# Patient Record
Sex: Female | Born: 1990 | Hispanic: Yes | Marital: Married | Smoking: Former smoker
Health system: Southern US, Community
[De-identification: ages and names within clinical notes are randomized; demographics above are authoritative.]

## PROBLEM LIST (undated history)

## (undated) DIAGNOSIS — Z789 Other specified health status: Secondary | ICD-10-CM

## (undated) HISTORY — PX: APPENDECTOMY: SHX54

---

## 2018-08-26 ENCOUNTER — Emergency Department (HOSPITAL_COMMUNITY): Payer: BLUE CROSS/BLUE SHIELD

## 2018-08-26 ENCOUNTER — Emergency Department (HOSPITAL_COMMUNITY)
Admission: EM | Admit: 2018-08-26 | Discharge: 2018-08-26 | Disposition: A | Discharge status: Home / Self Care | Payer: BLUE CROSS/BLUE SHIELD | Attending: Emergency Medicine | Admitting: Emergency Medicine

## 2018-08-26 ENCOUNTER — Other Ambulatory Visit: Payer: Self-pay

## 2018-08-26 ENCOUNTER — Encounter (HOSPITAL_COMMUNITY): Payer: Self-pay | Admitting: *Deleted

## 2018-08-26 DIAGNOSIS — N390 Urinary tract infection, site not specified: Secondary | ICD-10-CM | POA: Diagnosis not present

## 2018-08-26 DIAGNOSIS — R1030 Lower abdominal pain, unspecified: Secondary | ICD-10-CM | POA: Diagnosis present

## 2018-08-26 LAB — URINALYSIS, ROUTINE W REFLEX MICROSCOPIC
Bilirubin Urine: NEGATIVE
Glucose, UA: NEGATIVE mg/dL
Ketones, ur: 5 mg/dL — AB
NITRITE: POSITIVE — AB
Protein, ur: NEGATIVE mg/dL
Specific Gravity, Urine: 1.017 (ref 1.005–1.030)
pH: 6 (ref 5.0–8.0)

## 2018-08-26 LAB — COMPREHENSIVE METABOLIC PANEL
ALT: 24 U/L (ref 0–44)
AST: 23 U/L (ref 15–41)
Albumin: 4.4 g/dL (ref 3.5–5.0)
Alkaline Phosphatase: 60 U/L (ref 38–126)
Anion gap: 8 (ref 5–15)
BUN: 12 mg/dL (ref 6–20)
CO2: 23 mmol/L (ref 22–32)
Calcium: 9 mg/dL (ref 8.9–10.3)
Chloride: 107 mmol/L (ref 98–111)
Creatinine, Ser: 0.65 mg/dL (ref 0.44–1.00)
GFR calc Af Amer: >60 mL/min (ref >60)
GFR calc non Af Amer: >60 mL/min (ref >60)
Glucose, Bld: 93 mg/dL (ref 70–99)
Potassium: 3.4 mmol/L — ABNORMAL LOW (ref 3.5–5.1)
Sodium: 138 mmol/L (ref 135–145)
Total Bilirubin: 0.5 mg/dL (ref 0.3–1.2)
Total Protein: 7.7 g/dL (ref 6.5–8.1)

## 2018-08-26 LAB — CBC
HCT: 42.3 % (ref 36.0–46.0)
Hemoglobin: 13.8 g/dL (ref 12.0–15.0)
MCH: 29.5 pg (ref 26.0–34.0)
MCHC: 32.6 g/dL (ref 30.0–36.0)
MCV: 90.4 fL (ref 80.0–100.0)
Platelets: 306 10*3/uL (ref 150–400)
RBC: 4.68 MIL/uL (ref 3.87–5.11)
RDW: 12.8 % (ref 11.5–15.5)
WBC: 7.7 10*3/uL (ref 4.0–10.5)
nRBC: 0 % (ref 0.0–0.2)

## 2018-08-26 LAB — LIPASE, BLOOD: Lipase: 39 U/L (ref 11–51)

## 2018-08-26 LAB — I-STAT BETA HCG BLOOD, ED (MC, WL, AP ONLY)

## 2018-08-26 MED ORDER — CEPHALEXIN 500 MG PO CAPS: 500.0000 mg | ORAL_CAPSULE | Freq: Four times a day (QID) | ORAL | 0 refills | Status: AC

## 2018-08-26 MED ORDER — ONDANSETRON HCL 4 MG/2ML IJ SOLN
4.0000 mg | Freq: Once | INTRAMUSCULAR | Status: AC
  Administered 2018-08-26: 4 mg via INTRAVENOUS
  Filled 2018-08-26: qty 2

## 2018-08-26 MED ORDER — CEPHALEXIN 500 MG PO CAPS
500.0000 mg | ORAL_CAPSULE | Freq: Once | ORAL | Status: AC
  Administered 2018-08-26: 500 mg via ORAL
  Filled 2018-08-26: qty 1

## 2018-08-26 MED ORDER — ONDANSETRON 4 MG PO TBDP: 4.0000 mg | ORAL_TABLET | Freq: Three times a day (TID) | ORAL | 0 refills | Status: DC | PRN

## 2018-08-26 MED ORDER — SODIUM CHLORIDE 0.9 % IV BOLUS
1000.0000 mL | Freq: Once | INTRAVENOUS | Status: AC
  Administered 2018-08-26: 1000 mL via INTRAVENOUS

## 2018-08-26 MED ORDER — KETOROLAC TROMETHAMINE 30 MG/ML IJ SOLN
30.0000 mg | Freq: Once | INTRAMUSCULAR | Status: AC
  Administered 2018-08-26: 30 mg via INTRAVENOUS
  Filled 2018-08-26: qty 1

## 2018-08-26 NOTE — Discharge Instructions (Signed)
Stay very well hydrated with plenty of water throughout the day. Please take antibiotic until completion.  Zofran as needed for nausea.  Follow up with primary care physician in 1 week for recheck of ongoing symptoms.  Please seek immediate care if you develop the following: Your symptoms are no better or worse in 3 days. There is severe back pain or lower abdominal pain.  You have a fever.  There is vomiting.  New symptoms develop or you have any additional concerns.

## 2018-08-26 NOTE — ED Provider Notes (Signed)
[facility]-EMERGENCY DEPT Provider Note   CSN: [id number] Arrival date & time: 08/26/18  1407     History   Chief Complaint Chief Complaint  Patient presents with  . Abdominal Pain    HPI Latasha Powell is a 28 y.o. female.  The history is provided by the patient and medical records. A language interpreter was used (Secondary school teacherpanish video interpreter.).  Abdominal Pain  Associated symptoms: dysuria, nausea (Resolved) and vomiting (None today)   Associated symptoms: no constipation and no diarrhea    Latasha Powell is a 28 y.o. female  with a PMH of prior appendectomy who presents to the Emergency Department complaining of lower abdominal pain which began last night shortly after dinner. She went to the restroom and did have pain after urinating.  She denies any fever.  She did have nausea with 2 episodes of emesis last night.  No diarrhea.  She took two ibuprofen and pain improved.  She woke up at about 11 AM this morning and pain was much improved, however still present.  Has continued to have dysuria today.  No vomiting today.  She actually ate a piece of pizza in the waiting room and kept it down fine.  She reports she has history of prior appendectomy a few years ago when she had lower abdominal pain, therefore when her stomach started hurting she became concerned it could be another surgical emergency, therefore she thought she should be safe and come to the emergency department to get checked out.  History reviewed. No pertinent past medical history.  There are no active problems to display for this patient.   Past Surgical History:  Procedure Laterality Date  . APPENDECTOMY    . CESAREAN SECTION       OB History   No obstetric history on file.      Home Medications    Prior to Admission medications   Medication Sig Start Date End Date Taking? Authorizing Provider  cephALEXin (KEFLEX) 500 MG capsule Take 1 capsule (500 mg total)  by mouth 4 (four) times daily for 7 days. 08/26/18 09/02/18  Christalynn Boise, Chase PicketJaime Pilcher, PA-C  ondansetron (ZOFRAN ODT) 4 MG disintegrating tablet Take 1 tablet (4 mg total) by mouth every 8 (eight) hours as needed for nausea or vomiting. 08/26/18   Tex Conroy, Chase PicketJaime Pilcher, PA-C    Family History No family history on file.  Social History Social History   Tobacco Use  . Smoking status: Never Smoker  . Smokeless tobacco: Never Used  Substance Use Topics  . Alcohol use: Yes    Comment: occasional  . Drug use: Not on file     Allergies   Patient has no known allergies.   Review of Systems Review of Systems  Gastrointestinal: Positive for abdominal pain, nausea (Resolved) and vomiting (None today). Negative for constipation and diarrhea.  Genitourinary: Positive for dysuria.  All other systems reviewed and are negative.    Physical Exam Updated Vital Signs BP 119/80 (BP Location: Left Arm)   Pulse 82   Temp 97.8 F (36.6 C) (Oral)   Resp 16   Ht 5\' 3"  (1.6 m)   Wt 68.9 kg   LMP 08/15/2018   SpO2 100%   BMI 26.93 kg/m   Physical Exam Vitals signs and nursing note reviewed.  Constitutional:      General: She is not in acute distress.    Appearance: She is well-developed.     Comments: Nontoxic-appearing.  HENT:  Head: Normocephalic and atraumatic.  Neck:     Musculoskeletal: Neck supple.  Cardiovascular:     Rate and Rhythm: Normal rate and regular rhythm.     Heart sounds: Normal heart sounds. No murmur.  Pulmonary:     Effort: Pulmonary effort is normal. No respiratory distress.     Breath sounds: Normal breath sounds.  Abdominal:     General: There is no distension.     Palpations: Abdomen is soft.     Comments: No abdominal or flank tenderness.  Does have mild right CVA tenderness.  Skin:    General: Skin is warm and dry.  Neurological:     Mental Status: She is alert and oriented to person, place, and time.      ED Treatments / Results  Labs (all labs  ordered are listed, but only abnormal results are displayed) Labs Reviewed  COMPREHENSIVE METABOLIC PANEL - Abnormal; Notable for the following components:      Result Value   Potassium 3.4 (*)    All other components within normal limits  URINALYSIS, ROUTINE W REFLEX MICROSCOPIC - Abnormal; Notable for the following components:   APPearance HAZY (*)    Hgb urine dipstick SMALL (*)    Ketones, ur 5 (*)    Nitrite POSITIVE (*)    Leukocytes, UA TRACE (*)    Bacteria, UA MANY (*)    All other components within normal limits  LIPASE, BLOOD  CBC  I-STAT BETA HCG BLOOD, ED ([facility], [facility], [facility] ONLY)    EKG None  Radiology US Renal  Result Date: 08/26/2018 CLINICAL DATA:  Right flank pain for 1 day EXAM: RENAL / URINARY TRACT ULTRASOUND COMPLETE COMPARISON:  None. FINDINGS: Right Kidney: Renal measurements: 10.6 x 4.3 x 4.4 cm = volume: 107 mL . Echogenicity within normal limits. No mass or hydronephrosis visualized. Left Kidney: Renal measurements: 10.4 x 4.9 x 5.7 cm = volume: 154 mL. Echogenicity within normal limits. No mass or hydronephrosis visualized. Bladder: Appears normal for degree of bladder distention. IMPRESSION: Normal ultrasound of the kidneys and bladder.  No hydronephrosis. Electronically Signed   By: Delbert Phenix M.D.   On: 08/26/2018 21:30    Procedures Procedures (including critical care time)  Medications Ordered in ED Medications  sodium chloride 0.9 % bolus 1,000 mL (1,000 mLs Intravenous New Bag/Given 08/26/18 2119)  cephALEXin (KEFLEX) capsule 500 mg (has no administration in time range)  ondansetron [facility]) injection 4 mg (4 mg Intravenous Given 08/26/18 2119)  ketorolac (TORADOL) 30 MG/ML injection 30 mg (30 mg Intravenous Given 08/26/18 2122)     Initial Impression / Assessment and Plan / ED Course  I have reviewed the triage vital signs and the nursing notes.  Pertinent labs & imaging results that were available during my care of the patient were reviewed by me and  considered in my medical decision making (see chart for details).    Latasha Powell is a 28 y.o. female who presents to ED for dysuria and suprapubic pain which began last night.  Has had 2 episodes of vomiting yesterday, but none today.  Tolerating p.o. today and had a piece of pizza in the waiting room, tolerating that fine.  She is afebrile, nontoxic-appearing with benign abdominal exam.  Does have some mild CVA tenderness.  Labs reviewed and reassuring with normal white count.  Renal ultrasound without acute abnormalities.  UA nitrite positive with many bacteria.  Given dysuria, will treat with Keflex.  Follow-up with PCP strongly  encouraged.  Home care instructions and return precautions discussed and all questions answered.    Final Clinical Impressions(s) / ED Diagnoses   Final diagnoses:  Lower urinary tract infection    ED Discharge Orders         Ordered    cephALEXin (KEFLEX) 500 MG capsule  4 times daily     08/26/18 2206    ondansetron (ZOFRAN ODT) 4 MG disintegrating tablet  Every 8 hours PRN     08/26/18 2206           Zev Blue, Chase PicketJaime Pilcher, PA-C 08/26/18 2212    Tilden Fossaees, Elizabeth, MD 08/27/18 0025

## 2018-08-26 NOTE — ED Triage Notes (Signed)
Pt reports that last night she at a salad with chicken and shortly after she began to have abd pain and vomiting.  Pt took ibuprofen last night with no relief.  Pt reports that when she woke up this morning at 11am, pt still had the diffuse abd pain.  Pt states the pain does not feel like cramping in nature.  Pt denies diarrhea. Hx appendectomy.

## 2019-03-06 ENCOUNTER — Other Ambulatory Visit: Payer: Self-pay

## 2019-03-06 ENCOUNTER — Emergency Department (HOSPITAL_COMMUNITY)
Admission: EM | Admit: 2019-03-06 | Discharge: 2019-03-06 | Disposition: A | Discharge status: Home / Self Care | Payer: BLUE CROSS/BLUE SHIELD | Attending: Emergency Medicine | Admitting: Emergency Medicine

## 2019-03-06 ENCOUNTER — Encounter (HOSPITAL_COMMUNITY): Payer: Self-pay | Admitting: Emergency Medicine

## 2019-03-06 DIAGNOSIS — N10 Acute tubulo-interstitial nephritis: Secondary | ICD-10-CM | POA: Diagnosis not present

## 2019-03-06 DIAGNOSIS — R300 Dysuria: Secondary | ICD-10-CM | POA: Diagnosis present

## 2019-03-06 LAB — URINALYSIS, ROUTINE W REFLEX MICROSCOPIC
Bilirubin Urine: NEGATIVE
Glucose, UA: NEGATIVE mg/dL
Ketones, ur: NEGATIVE mg/dL
Nitrite: POSITIVE — AB
Protein, ur: 30 mg/dL — AB
RBC / HPF: >50 RBC/hpf — ABNORMAL HIGH (ref 0–5)
Specific Gravity, Urine: 1.028 (ref 1.005–1.030)
pH: 6 (ref 5.0–8.0)

## 2019-03-06 MED ORDER — CEFTRIAXONE SODIUM 1 G IJ SOLR
1.0000 g | Freq: Once | INTRAMUSCULAR | Status: AC
  Administered 2019-03-06: 1 g via INTRAMUSCULAR
  Filled 2019-03-06: qty 10

## 2019-03-06 MED ORDER — STERILE WATER FOR INJECTION IJ SOLN
INTRAMUSCULAR | Status: AC
  Administered 2019-03-06: 2.1 mL
  Filled 2019-03-06: qty 10

## 2019-03-06 MED ORDER — HYDROCODONE-ACETAMINOPHEN 5-325 MG PO TABS
1.0000 | ORAL_TABLET | Freq: Once | ORAL | Status: AC
  Administered 2019-03-06: 1 via ORAL
  Filled 2019-03-06: qty 1

## 2019-03-06 MED ORDER — PHENAZOPYRIDINE HCL 97.2 MG PO TABS: 97.0000 mg | ORAL_TABLET | Freq: Three times a day (TID) | ORAL | 0 refills | Status: DC | PRN

## 2019-03-06 MED ORDER — CEPHALEXIN 500 MG PO CAPS: 500.0000 mg | ORAL_CAPSULE | Freq: Four times a day (QID) | ORAL | 0 refills | Status: AC

## 2019-03-06 NOTE — Discharge Instructions (Addendum)
¡  Tome todos sus antibiticos hasta terminar! Tome sus antibiticos con comida. Los efectos secundarios comunes de los antibiticos incluyen nuseas, vmitos, Tree surgeon abdominal y [place]. Puede ayudar a compensar algo de esto con probiticos que puede comprar u Agricultural engineer. No coma ni tome los probiticos hasta 2 horas despus de su antibitico.  Algunos estudios sugieren que ciertos antibiticos pueden reducir la eficacia de ciertas pldoras anticonceptivas orales (control de la natalidad), as que use anticonceptivos adicionales (condones u otro mtodo de Chiropractor) mientras est tomando los antibiticos y [place] [address] [place] despus si est tomando los antibiticos. una mujer con estos medicamentos.  Alterne 600 mg de ibuprofeno y [phone] mg de Tylenol cada 3 horas segn sea necesario para el dolor. No exceda los 4000 mg de Tylenol al da.  Tome Azo para el tratamiento del ardor al Garment/textile technologist.  Descanse y beba muchos lquidos.  Haga un seguimiento con su mdico de cabecera en 3 das para discutir sus diagnsticos y una evaluacin adicional despus de la visita de hoy; regrese a la sala de emergencias si tiene fiebre, vmitos persistentes, empeoramiento del dolor abdominal u otros sntomas preocupantes.  Please take all of your antibiotics until finished!   Take your antibiotics with food.  Common side effects of antibiotics include nausea, vomiting, abdominal discomfort, and diarrhea. You may help offset some of this with probiotics which you can buy or get in yogurt. Do not eat  or take the probiotics until 2 hours after your antibiotic.    Some studies suggest that certain antibiotics can reduce the efficacy of certain oral contraceptive pills (birth control), so please use additional contraceptives (condoms or other barrier method) while you are taking the antibiotics and for an additional 5 to 7 days afterwards if you are a female on these medications.  Alternate 600 mg of ibuprofen  and [phone] mg of Tylenol every 3 hours as needed for pain. Do not exceed 4000 mg of Tylenol daily.  Take Azo for treatment of burning when you pee.  Rest and drink plenty of fluids.  Please followup with your primary doctor in 3 days for discussion of your diagnoses and further evaluation after today's visit; return to the ER for fevers, persistent vomiting, worsening abdominal pain or other concerning symptoms.

## 2019-03-06 NOTE — ED Triage Notes (Signed)
Patient c/o pain with urination since yesterday. Denies N/V/D.

## 2019-03-06 NOTE — ED Provider Notes (Signed)
[facility] DEPT Provider Note   CSN: [id number] Arrival date & time: 03/06/19  2046    History   Chief Complaint Chief Complaint  Patient presents with  . Dysuria    HPI Latasha Powell is a 28 y.o. female with history of prior appendectomy presents for evaluation of acute onset, persistent and progressively worsening right flank pain and urinary symptoms since yesterday.  She reports dysuria, urgency, frequency, denies hematuria.  She has had no nausea or vomiting, no fevers but has had some subjective chills.  Pain radiates from the right flank down to the suprapubic region.  Symptoms worsen with certain movements.  She denies vaginal itching or discharge and reports that she is currently completing her menstrual cycle which was normal for her.  She reports no concern for STDs.  She has been taking ibuprofen with some temporary relief of her symptoms.  Presented to the ED with similar symptoms in February and reports this feels similar.  Was treated for UTI with resolution of her symptoms.     The history is provided by the patient.    History reviewed. No pertinent past medical history.  There are no active problems to display for this patient.   Past Surgical History:  Procedure Laterality Date  . APPENDECTOMY    . CESAREAN SECTION       OB History   No obstetric history on file.      Home Medications    Prior to Admission medications   Medication Sig Start Date End Date Taking? Authorizing Provider  ibuprofen (ADVIL) 200 MG tablet Take 200 mg by mouth every 6 (six) hours as needed for moderate pain.   Yes [provider]  cephALEXin (KEFLEX) 500 MG capsule Take 1 capsule (500 mg total) by mouth 4 (four) times daily for 10 days. 03/06/19 03/16/19  Rodell Perna A, PA-C  ondansetron (ZOFRAN ODT) 4 MG disintegrating tablet Take 1 tablet (4 mg total) by mouth every 8 (eight) hours as needed for nausea or vomiting. Patient  not taking: Reported on 03/06/2019 08/26/18   Ward, Ozella Almond, PA-C  phenazopyridine (PYRIDIUM) 97 MG tablet Take 1 tablet (97 mg total) by mouth 3 (three) times daily as needed for pain. 03/06/19   Renita Papa, PA-C    Family History No family history on file.  Social History Social History   Tobacco Use  . Smoking status: Never Smoker  . Smokeless tobacco: Never Used  Substance Use Topics  . Alcohol use: Yes    Comment: occasional  . Drug use: Not on file     Allergies   Patient has no known allergies.   Review of Systems Review of Systems  Constitutional: Positive for chills. Negative for fatigue and fever.  Gastrointestinal: Positive for abdominal pain. Negative for diarrhea, nausea and vomiting.  Genitourinary: Positive for dysuria, flank pain, frequency, urgency and vaginal bleeding (currently mednsturating). Negative for hematuria, vaginal discharge and vaginal pain.  All other systems reviewed and are negative.    Physical Exam Updated Vital Signs BP 114/81   Pulse 84   Temp 98.5 F (36.9 C) (Oral)   Resp 18   LMP 02/27/2019 (Approximate)   SpO2 100%   Physical Exam Vitals signs and nursing note reviewed.  Constitutional:      General: She is not in acute distress.    Appearance: She is well-developed.  HENT:     Head: Normocephalic and atraumatic.  Eyes:     General:  Right eye: No discharge.        Left eye: No discharge.     Conjunctiva/sclera: Conjunctivae normal.  Neck:     Musculoskeletal: Neck supple.     Vascular: No JVD.     Trachea: No tracheal deviation.  Cardiovascular:     Rate and Rhythm: Normal rate and regular rhythm.  Pulmonary:     Effort: Pulmonary effort is normal.     Breath sounds: Normal breath sounds.  Abdominal:     General: Abdomen is flat. Bowel sounds are normal. There is no distension.     Palpations: Abdomen is soft.     Tenderness: There is abdominal tenderness in the right lower quadrant and suprapubic  area. There is right CVA tenderness. There is no guarding or rebound.  Genitourinary:    Comments: deferred  Skin:    General: Skin is warm and dry.     Findings: No erythema.  Neurological:     Mental Status: She is alert.  Psychiatric:        Behavior: Behavior normal.      ED Treatments / Results  Labs (all labs ordered are listed, but only abnormal results are displayed) Labs Reviewed  URINALYSIS, ROUTINE W REFLEX MICROSCOPIC - Abnormal; Notable for the following components:      Result Value   APPearance HAZY (*)    Hgb urine dipstick LARGE (*)    Protein, ur 30 (*)    Nitrite POSITIVE (*)    Leukocytes,Ua SMALL (*)    RBC / HPF >50 (*)    Bacteria, UA MANY (*)    All other components within normal limits  URINE CULTURE    EKG None  Radiology No results found.  Procedures Procedures (including critical care time)  Medications Ordered in ED Medications  sterile water (preservative free) injection (has no administration in time range)  cefTRIAXone (ROCEPHIN) injection 1 g (1 g Intramuscular Given 03/06/19 2346)  HYDROcodone-acetaminophen (NORCO/VICODIN) 5-325 MG per tablet 1 tablet (1 tablet Oral Given 03/06/19 2340)     Initial Impression / Assessment and Plan / ED Course  I have reviewed the triage vital signs and the nursing notes.  Pertinent labs & imaging results that were available during my care of the patient were reviewed by me and considered in my medical decision making (see chart for details).        Patient with urinary symptoms, subjective chills, right flank pain since yesterday.  She is afebrile, vital signs are stable.  She is nontoxic in appearance.  No peritoneal signs on examination of the abdomen.  She has no concern for STDs and she has no GU complaints, currently completing her menstrual cycle as expected.  UA suggestive of UTI.  Coupled with flank pain, diagnosis of acute pyelonephritis.  Given she is tolerating p.o. and has no  constitutional symptoms, doubt complicated pyelonephritis, renal abscess, nephrolithiasis.  She is already status post a sinusitis and her exam is not concerning for acute surgical abdominal pathology.  Doubt PID, ovarian torsion, TOA, or ectopic pregnancy.  Will discharge with course of antibiotics and Pyridium for symptoms.  Recommend follow-up with PCP for reevaluation of symptoms.  We will culture her urine.  Discussed strict ED return precautions. Patient verbalized understanding of and agreement with plan and is safe for discharge home at this time.   Final Clinical Impressions(s) / ED Diagnoses   Final diagnoses:  Acute pyelonephritis    ED Discharge Orders  Ordered    cephALEXin (KEFLEX) 500 MG capsule  4 times daily     03/06/19 2327    phenazopyridine (PYRIDIUM) 97 MG tablet  3 times daily PRN     03/06/19 2332           Jeanie SewerFawze, Lilyahna Sirmon A, PA-C 03/06/19 2349    Wynetta FinesMessick, Peter C, MD 03/07/19 [phone]

## 2019-03-06 NOTE — ED Notes (Signed)
Urine and culture sent to lab  

## 2019-03-09 LAB — URINE CULTURE: Culture: >=100000 — AB

## 2019-03-10 ENCOUNTER — Telehealth: Payer: Self-pay | Admitting: Emergency Medicine

## 2019-03-10 NOTE — Telephone Encounter (Signed)
Post ED Visit - Positive Culture Follow-up  Culture report reviewed by antimicrobial stewardship pharmacist: [place] Team []  Elenor Quinones, Pharm.D. []  Heide Guile, Pharm.D., BCPS AQ-ID []  Parks Neptune, Pharm.D., BCPS []  Alycia Rossetti, Pharm.D., BCPS []  [place], Pharm.D., BCPS, AAHIVP []  Legrand Como, Pharm.D., BCPS, AAHIVP []  Salome Arnt, PharmD, BCPS []  Johnnette Gourd, PharmD, BCPS []  Hughes Better, PharmD, BCPS []  Leeroy Cha, PharmD []  Laqueta Linden, PharmD, BCPS []  Albertina Parr, PharmD  [place] Team []  Leodis Sias, PharmD []  Lindell Spar, PharmD []  Royetta Asal, PharmD []  Graylin Shiver, Rph []  Rema Fendt) Glennon Mac, PharmD []  Arlyn Dunning, PharmD []  Netta Cedars, PharmD []  Dia Sitter, PharmD []  Leone Haven, PharmD []  Gretta Arab, PharmD []  Theodis Shove, PharmD []  Peggyann Juba, PharmD [x]  Dolly Rias, PharmD   Positive urine culture Treated with Cephalexin, organism sensitive to the same and no further patient follow-up is required at this time.  [place] 03/10/2019, 1:08 PM

## 2019-10-25 ENCOUNTER — Emergency Department (HOSPITAL_COMMUNITY): Payer: 59

## 2019-10-25 ENCOUNTER — Inpatient Hospital Stay (HOSPITAL_COMMUNITY)
Admission: EM | Admit: 2019-10-25 | Discharge: 2019-10-31 | DRG: 958 | Disposition: A | Discharge status: Home / Self Care | Payer: 59 | Attending: General Surgery | Admitting: General Surgery

## 2019-10-25 ENCOUNTER — Observation Stay (HOSPITAL_COMMUNITY): Payer: 59

## 2019-10-25 ENCOUNTER — Encounter (HOSPITAL_COMMUNITY): Admission: EM | Disposition: A | Discharge status: Home / Self Care | Payer: Self-pay | Source: Home / Self Care

## 2019-10-25 ENCOUNTER — Observation Stay (HOSPITAL_COMMUNITY): Payer: 59 | Admitting: Certified Registered"

## 2019-10-25 ENCOUNTER — Encounter (HOSPITAL_COMMUNITY): Payer: Self-pay

## 2019-10-25 DIAGNOSIS — S42401B Unspecified fracture of lower end of right humerus, initial encounter for open fracture: Secondary | ICD-10-CM

## 2019-10-25 DIAGNOSIS — S270XXA Traumatic pneumothorax, initial encounter: Secondary | ICD-10-CM | POA: Diagnosis present

## 2019-10-25 DIAGNOSIS — S52021B Displaced fracture of olecranon process without intraarticular extension of right ulna, initial encounter for open fracture type I or II: Principal | ICD-10-CM | POA: Diagnosis present

## 2019-10-25 DIAGNOSIS — S46311A Strain of muscle, fascia and tendon of triceps, right arm, initial encounter: Secondary | ICD-10-CM | POA: Diagnosis present

## 2019-10-25 DIAGNOSIS — R319 Hematuria, unspecified: Secondary | ICD-10-CM | POA: Diagnosis not present

## 2019-10-25 DIAGNOSIS — S80211A Abrasion, right knee, initial encounter: Secondary | ICD-10-CM | POA: Diagnosis present

## 2019-10-25 DIAGNOSIS — S40211A Abrasion of right shoulder, initial encounter: Secondary | ICD-10-CM | POA: Diagnosis present

## 2019-10-25 DIAGNOSIS — Y92410 Unspecified street and highway as the place of occurrence of the external cause: Secondary | ICD-10-CM

## 2019-10-25 DIAGNOSIS — J939 Pneumothorax, unspecified: Secondary | ICD-10-CM

## 2019-10-25 DIAGNOSIS — S060X0A Concussion without loss of consciousness, initial encounter: Secondary | ICD-10-CM | POA: Diagnosis present

## 2019-10-25 DIAGNOSIS — T1490XA Injury, unspecified, initial encounter: Secondary | ICD-10-CM

## 2019-10-25 DIAGNOSIS — S21101A Unspecified open wound of right front wall of thorax without penetration into thoracic cavity, initial encounter: Secondary | ICD-10-CM | POA: Diagnosis not present

## 2019-10-25 DIAGNOSIS — Z87891 Personal history of nicotine dependence: Secondary | ICD-10-CM

## 2019-10-25 DIAGNOSIS — Z23 Encounter for immunization: Secondary | ICD-10-CM

## 2019-10-25 DIAGNOSIS — M25551 Pain in right hip: Secondary | ICD-10-CM | POA: Diagnosis not present

## 2019-10-25 DIAGNOSIS — N898 Other specified noninflammatory disorders of vagina: Secondary | ICD-10-CM | POA: Diagnosis not present

## 2019-10-25 DIAGNOSIS — Z793 Long term (current) use of hormonal contraceptives: Secondary | ICD-10-CM

## 2019-10-25 DIAGNOSIS — W19XXXA Unspecified fall, initial encounter: Secondary | ICD-10-CM

## 2019-10-25 DIAGNOSIS — Z20822 Contact with and (suspected) exposure to covid-19: Secondary | ICD-10-CM | POA: Diagnosis not present

## 2019-10-25 DIAGNOSIS — Z9181 History of falling: Secondary | ICD-10-CM

## 2019-10-25 DIAGNOSIS — T07XXXA Unspecified multiple injuries, initial encounter: Secondary | ICD-10-CM

## 2019-10-25 HISTORY — DX: Other specified health status: Z78.9

## 2019-10-25 HISTORY — PX: I & D EXTREMITY: SHX5045

## 2019-10-25 HISTORY — DX: Unspecified fracture of lower end of right humerus, initial encounter for open fracture: S42.401B

## 2019-10-25 LAB — CBC
HCT: 41.7 % (ref 36.0–46.0)
Hemoglobin: 13.4 g/dL (ref 12.0–15.0)
MCH: 29.7 pg (ref 26.0–34.0)
MCHC: 32.1 g/dL (ref 30.0–36.0)
MCV: 92.5 fL (ref 80.0–100.0)
Platelets: 351 10*3/uL (ref 150–400)
RBC: 4.51 MIL/uL (ref 3.87–5.11)
RDW: 12.7 % (ref 11.5–15.5)
WBC: 9.3 10*3/uL (ref 4.0–10.5)
nRBC: 0 % (ref 0.0–0.2)

## 2019-10-25 LAB — COMPREHENSIVE METABOLIC PANEL
ALT: 51 U/L — ABNORMAL HIGH (ref 0–44)
AST: 73 U/L — ABNORMAL HIGH (ref 15–41)
Albumin: 4.1 g/dL (ref 3.5–5.0)
Alkaline Phosphatase: 60 U/L (ref 38–126)
Anion gap: 13 (ref 5–15)
BUN: 17 mg/dL (ref 6–20)
CO2: 23 mmol/L (ref 22–32)
Calcium: 9.2 mg/dL (ref 8.9–10.3)
Chloride: 105 mmol/L (ref 98–111)
Creatinine, Ser: 1 mg/dL (ref 0.44–1.00)
GFR calc Af Amer: >60 mL/min (ref >60)
GFR calc non Af Amer: >60 mL/min (ref >60)
Glucose, Bld: 128 mg/dL — ABNORMAL HIGH (ref 70–99)
Potassium: 3.9 mmol/L (ref 3.5–5.1)
Sodium: 141 mmol/L (ref 135–145)
Total Bilirubin: 1.2 mg/dL (ref 0.3–1.2)
Total Protein: 6.8 g/dL (ref 6.5–8.1)

## 2019-10-25 LAB — URINALYSIS, ROUTINE W REFLEX MICROSCOPIC
Bilirubin Urine: NEGATIVE
Glucose, UA: NEGATIVE mg/dL
Hgb urine dipstick: NEGATIVE
Ketones, ur: NEGATIVE mg/dL
Leukocytes,Ua: NEGATIVE
Nitrite: POSITIVE — AB
Protein, ur: 30 mg/dL — AB
Specific Gravity, Urine: >1.046 — ABNORMAL HIGH (ref 1.005–1.030)
pH: 7 (ref 5.0–8.0)

## 2019-10-25 LAB — I-STAT CHEM 8, ED
BUN: 21 mg/dL — ABNORMAL HIGH (ref 6–20)
Calcium, Ion: 1.21 mmol/L (ref 1.15–1.40)
Chloride: 104 mmol/L (ref 98–111)
Creatinine, Ser: 0.9 mg/dL (ref 0.44–1.00)
Glucose, Bld: 123 mg/dL — ABNORMAL HIGH (ref 70–99)
HCT: 40 % (ref 36.0–46.0)
Hemoglobin: 13.6 g/dL (ref 12.0–15.0)
Potassium: 3.4 mmol/L — ABNORMAL LOW (ref 3.5–5.1)
Sodium: 142 mmol/L (ref 135–145)
TCO2: 27 mmol/L (ref 22–32)

## 2019-10-25 LAB — RESPIRATORY PANEL BY RT PCR (FLU A&B, COVID)
Influenza A by PCR: NEGATIVE
Influenza B by PCR: NEGATIVE
SARS Coronavirus 2 by RT PCR: NEGATIVE

## 2019-10-25 LAB — I-STAT BETA HCG BLOOD, ED (MC, WL, AP ONLY): I-stat hCG, quantitative: <5 m[IU]/mL (ref <5)

## 2019-10-25 LAB — LACTIC ACID, PLASMA: Lactic Acid, Venous: 1.9 mmol/L (ref 0.5–1.9)

## 2019-10-25 LAB — SAMPLE TO BLOOD BANK

## 2019-10-25 LAB — PROTIME-INR
INR: 1.1 (ref 0.8–1.2)
Prothrombin Time: 14 seconds (ref 11.4–15.2)

## 2019-10-25 LAB — ETHANOL: Alcohol, Ethyl (B): <10 mg/dL (ref <10)

## 2019-10-25 SURGERY — IRRIGATION AND DEBRIDEMENT EXTREMITY
Anesthesia: General | Site: Elbow | Laterality: Right

## 2019-10-25 MED ORDER — ENOXAPARIN SODIUM 40 MG/0.4ML ~~LOC~~ SOLN
40.0000 mg | SUBCUTANEOUS | Status: DC
  Administered 2019-10-26: 40 mg via SUBCUTANEOUS

## 2019-10-25 MED ORDER — BACITRACIN-NEOMYCIN-POLYMYXIN OINTMENT TUBE
1.0000 "application " | TOPICAL_OINTMENT | Freq: Every day | CUTANEOUS | Status: DC
  Administered 2019-10-26 – 2019-10-31 (×6): 1 via TOPICAL
  Filled 2019-10-25: qty 14

## 2019-10-25 MED ORDER — HYDROMORPHONE HCL 1 MG/ML IJ SOLN
1.0000 mg | Freq: Once | INTRAMUSCULAR | Status: AC
  Administered 2019-10-25: 1 mg via INTRAVENOUS
  Filled 2019-10-25: qty 1

## 2019-10-25 MED ORDER — TETANUS-DIPHTH-ACELL PERTUSSIS 5-2.5-18.5 LF-MCG/0.5 IM SUSP
0.5000 mL | Freq: Once | INTRAMUSCULAR | Status: AC
  Administered 2019-10-25: 13:00:00 0.5 mL via INTRAMUSCULAR
  Filled 2019-10-25: qty 0.5

## 2019-10-25 MED ORDER — ONDANSETRON HCL 4 MG/2ML IJ SOLN: 4.0000 mg | Freq: Four times a day (QID) | INTRAMUSCULAR | Status: DC | PRN

## 2019-10-25 MED ORDER — FENTANYL CITRATE (PF) 100 MCG/2ML IJ SOLN
INTRAMUSCULAR | Status: AC | PRN
  Administered 2019-10-25: 50 ug via INTRAVENOUS

## 2019-10-25 MED ORDER — HYDROMORPHONE HCL 1 MG/ML IJ SOLN
0.2500 mg | INTRAMUSCULAR | Status: DC | PRN
  Administered 2019-10-25 (×2): 0.25 mg via INTRAVENOUS
  Administered 2019-10-25 (×3): 0.5 mg via INTRAVENOUS

## 2019-10-25 MED ORDER — OXYCODONE HCL 5 MG PO TABS: 5.0000 mg | ORAL_TABLET | ORAL | Status: DC | PRN

## 2019-10-25 MED ORDER — POLYETHYLENE GLYCOL 3350 17 G PO PACK: 17.0000 g | PACK | Freq: Every day | ORAL | Status: DC | PRN

## 2019-10-25 MED ORDER — METOCLOPRAMIDE HCL 5 MG/ML IJ SOLN: 5.0000 mg | Freq: Three times a day (TID) | INTRAMUSCULAR | Status: DC | PRN

## 2019-10-25 MED ORDER — BISACODYL 5 MG PO TBEC: 5.0000 mg | DELAYED_RELEASE_TABLET | Freq: Every day | ORAL | Status: DC | PRN

## 2019-10-25 MED ORDER — TOBRAMYCIN SULFATE 1.2 G IJ SOLR
INTRAMUSCULAR | Status: DC | PRN
  Administered 2019-10-25: 1.2 g via TOPICAL

## 2019-10-25 MED ORDER — LACTATED RINGERS IV SOLN: INTRAVENOUS | Status: DC

## 2019-10-25 MED ORDER — VANCOMYCIN HCL 1000 MG IV SOLR
INTRAVENOUS | Status: AC
  Filled 2019-10-25: qty 1000

## 2019-10-25 MED ORDER — MIDAZOLAM HCL 2 MG/2ML IJ SOLN
INTRAMUSCULAR | Status: DC | PRN
  Administered 2019-10-25: 2 mg via INTRAVENOUS

## 2019-10-25 MED ORDER — PROPOFOL 10 MG/ML IV BOLUS
INTRAVENOUS | Status: DC | PRN
  Administered 2019-10-25: 150 mg via INTRAVENOUS

## 2019-10-25 MED ORDER — CEFAZOLIN SODIUM-DEXTROSE 2-4 GM/100ML-% IV SOLN
2.0000 g | Freq: Three times a day (TID) | INTRAVENOUS | Status: DC
  Filled 2019-10-25: qty 100

## 2019-10-25 MED ORDER — METRONIDAZOLE IN NACL 5-0.79 MG/ML-% IV SOLN
500.0000 mg | Freq: Three times a day (TID) | INTRAVENOUS | Status: AC
  Administered 2019-10-26 (×3): 500 mg via INTRAVENOUS
  Filled 2019-10-25 (×3): qty 100

## 2019-10-25 MED ORDER — ONDANSETRON HCL 4 MG PO TABS: 4.0000 mg | ORAL_TABLET | Freq: Four times a day (QID) | ORAL | Status: DC | PRN

## 2019-10-25 MED ORDER — INDOMETHACIN 25 MG PO CAPS
50.0000 mg | ORAL_CAPSULE | Freq: Three times a day (TID) | ORAL | Status: DC
  Administered 2019-10-26 – 2019-10-28 (×8): 50 mg via ORAL
  Filled 2019-10-25 (×10): qty 2

## 2019-10-25 MED ORDER — MIDAZOLAM HCL 2 MG/2ML IJ SOLN
INTRAMUSCULAR | Status: AC
  Filled 2019-10-25: qty 2

## 2019-10-25 MED ORDER — FENTANYL CITRATE (PF) 100 MCG/2ML IJ SOLN
INTRAMUSCULAR | Status: AC
  Filled 2019-10-25: qty 2

## 2019-10-25 MED ORDER — OXYCODONE HCL 5 MG/5ML PO SOLN: 5.0000 mg | Freq: Once | ORAL | Status: DC | PRN

## 2019-10-25 MED ORDER — VANCOMYCIN HCL 1000 MG IV SOLR
INTRAVENOUS | Status: DC | PRN
  Administered 2019-10-25: 1000 mg via TOPICAL

## 2019-10-25 MED ORDER — OXYCODONE HCL 5 MG PO TABS
10.0000 mg | ORAL_TABLET | ORAL | Status: DC | PRN
  Administered 2019-10-25 – 2019-10-26 (×2): 10 mg via ORAL
  Filled 2019-10-25 (×2): qty 2

## 2019-10-25 MED ORDER — FENTANYL CITRATE (PF) 250 MCG/5ML IJ SOLN
INTRAMUSCULAR | Status: DC | PRN
  Administered 2019-10-25: 50 ug via INTRAVENOUS
  Administered 2019-10-25: 100 ug via INTRAVENOUS

## 2019-10-25 MED ORDER — DOCUSATE SODIUM 100 MG PO CAPS
100.0000 mg | ORAL_CAPSULE | Freq: Two times a day (BID) | ORAL | Status: DC
  Administered 2019-10-25 – 2019-10-31 (×11): 100 mg via ORAL
  Filled 2019-10-25 (×10): qty 1

## 2019-10-25 MED ORDER — BUPIVACAINE HCL (PF) 0.25 % IJ SOLN
INTRAMUSCULAR | Status: AC
  Filled 2019-10-25: qty 30

## 2019-10-25 MED ORDER — POVIDONE-IODINE 10 % EX SWAB: 2.0000 "application " | Freq: Once | CUTANEOUS | Status: DC

## 2019-10-25 MED ORDER — FENTANYL CITRATE (PF) 100 MCG/2ML IJ SOLN
50.0000 ug | Freq: Once | INTRAMUSCULAR | Status: AC
  Administered 2019-10-25: 50 ug via INTRAVENOUS

## 2019-10-25 MED ORDER — HYDROMORPHONE HCL 1 MG/ML IJ SOLN
0.5000 mg | INTRAMUSCULAR | Status: DC | PRN
  Administered 2019-10-26 (×2): 1 mg via INTRAVENOUS
  Filled 2019-10-25 (×2): qty 1

## 2019-10-25 MED ORDER — ONDANSETRON HCL 4 MG/2ML IJ SOLN
4.0000 mg | Freq: Once | INTRAMUSCULAR | Status: AC
  Administered 2019-10-25: 12:00:00 4 mg via INTRAVENOUS

## 2019-10-25 MED ORDER — KETOROLAC TROMETHAMINE 30 MG/ML IJ SOLN: 30.0000 mg | Freq: Once | INTRAMUSCULAR | Status: DC

## 2019-10-25 MED ORDER — LIDOCAINE 2% (20 MG/ML) 5 ML SYRINGE
INTRAMUSCULAR | Status: DC | PRN
  Administered 2019-10-25: 60 mg via INTRAVENOUS

## 2019-10-25 MED ORDER — ACETAMINOPHEN 500 MG PO TABS
1000.0000 mg | ORAL_TABLET | Freq: Three times a day (TID) | ORAL | Status: DC
  Administered 2019-10-25 – 2019-10-26 (×2): 1000 mg via ORAL
  Filled 2019-10-25 (×2): qty 2

## 2019-10-25 MED ORDER — FENTANYL CITRATE (PF) 250 MCG/5ML IJ SOLN
INTRAMUSCULAR | Status: AC
  Filled 2019-10-25: qty 5

## 2019-10-25 MED ORDER — PROPOFOL 10 MG/ML IV BOLUS
INTRAVENOUS | Status: AC
  Filled 2019-10-25: qty 20

## 2019-10-25 MED ORDER — SODIUM CHLORIDE 0.9 % IR SOLN
Status: DC | PRN
  Administered 2019-10-25: 6000 mL

## 2019-10-25 MED ORDER — ACETAMINOPHEN 10 MG/ML IV SOLN
INTRAVENOUS | Status: AC
  Filled 2019-10-25: qty 100

## 2019-10-25 MED ORDER — MORPHINE SULFATE (PF) 2 MG/ML IV SOLN: 2.0000 mg | INTRAVENOUS | Status: DC | PRN

## 2019-10-25 MED ORDER — METHOCARBAMOL 500 MG PO TABS: 500.0000 mg | ORAL_TABLET | Freq: Four times a day (QID) | ORAL | Status: DC | PRN

## 2019-10-25 MED ORDER — ONDANSETRON HCL 4 MG/2ML IJ SOLN
INTRAMUSCULAR | Status: AC
  Filled 2019-10-25: qty 2

## 2019-10-25 MED ORDER — ENOXAPARIN SODIUM 40 MG/0.4ML ~~LOC~~ SOLN: 40.0000 mg | SUBCUTANEOUS | Status: DC

## 2019-10-25 MED ORDER — 0.9 % SODIUM CHLORIDE (POUR BTL) OPTIME
TOPICAL | Status: DC | PRN
  Administered 2019-10-25: 1000 mL

## 2019-10-25 MED ORDER — SUCCINYLCHOLINE CHLORIDE 200 MG/10ML IV SOSY
PREFILLED_SYRINGE | INTRAVENOUS | Status: DC | PRN
  Administered 2019-10-25: 60 mg via INTRAVENOUS

## 2019-10-25 MED ORDER — METOCLOPRAMIDE HCL 5 MG PO TABS: 5.0000 mg | ORAL_TABLET | Freq: Three times a day (TID) | ORAL | Status: DC | PRN

## 2019-10-25 MED ORDER — HYDROMORPHONE HCL 1 MG/ML IJ SOLN
INTRAMUSCULAR | Status: AC
  Filled 2019-10-25: qty 1

## 2019-10-25 MED ORDER — DIPHENHYDRAMINE HCL 12.5 MG/5ML PO ELIX: 12.5000 mg | ORAL_SOLUTION | ORAL | Status: DC | PRN

## 2019-10-25 MED ORDER — ONDANSETRON HCL 4 MG/2ML IJ SOLN
4.0000 mg | Freq: Four times a day (QID) | INTRAMUSCULAR | Status: DC | PRN
  Administered 2019-10-26 – 2019-10-30 (×5): 4 mg via INTRAVENOUS
  Filled 2019-10-25 (×5): qty 2

## 2019-10-25 MED ORDER — CEFAZOLIN SODIUM-DEXTROSE 2-4 GM/100ML-% IV SOLN: 2.0000 g | Freq: Three times a day (TID) | INTRAVENOUS | Status: DC

## 2019-10-25 MED ORDER — ONDANSETRON 4 MG PO TBDP: 4.0000 mg | ORAL_TABLET | Freq: Four times a day (QID) | ORAL | Status: DC | PRN

## 2019-10-25 MED ORDER — ACETAMINOPHEN 10 MG/ML IV SOLN
1000.0000 mg | Freq: Once | INTRAVENOUS | Status: AC
  Administered 2019-10-25: 1000 mg via INTRAVENOUS

## 2019-10-25 MED ORDER — KETOROLAC TROMETHAMINE 30 MG/ML IJ SOLN
INTRAMUSCULAR | Status: DC | PRN
  Administered 2019-10-25: 30 mg via INTRAVENOUS

## 2019-10-25 MED ORDER — CEFAZOLIN SODIUM-DEXTROSE 2-4 GM/100ML-% IV SOLN: 2.0000 g | INTRAVENOUS | Status: DC

## 2019-10-25 MED ORDER — IOHEXOL 300 MG/ML  SOLN
100.0000 mL | Freq: Once | INTRAMUSCULAR | Status: AC | PRN
  Administered 2019-10-25: 100 mL via INTRAVENOUS

## 2019-10-25 MED ORDER — PROMETHAZINE HCL 25 MG/ML IJ SOLN: 6.2500 mg | INTRAMUSCULAR | Status: DC | PRN

## 2019-10-25 MED ORDER — CEFAZOLIN SODIUM-DEXTROSE 2-4 GM/100ML-% IV SOLN
2.0000 g | Freq: Once | INTRAVENOUS | Status: AC
  Administered 2019-10-25: 13:00:00 2 g via INTRAVENOUS
  Filled 2019-10-25: qty 100

## 2019-10-25 MED ORDER — ZOLPIDEM TARTRATE 5 MG PO TABS
5.0000 mg | ORAL_TABLET | Freq: Every evening | ORAL | Status: DC | PRN
  Administered 2019-10-26: 01:00:00 5 mg via ORAL
  Filled 2019-10-25: qty 1

## 2019-10-25 MED ORDER — DOCUSATE SODIUM 100 MG PO CAPS: 100.0000 mg | ORAL_CAPSULE | Freq: Two times a day (BID) | ORAL | Status: DC

## 2019-10-25 MED ORDER — CEFAZOLIN SODIUM-DEXTROSE 2-4 GM/100ML-% IV SOLN
2.0000 g | Freq: Three times a day (TID) | INTRAVENOUS | Status: AC
  Administered 2019-10-25 – 2019-10-26 (×3): 2 g via INTRAVENOUS
  Filled 2019-10-25 (×3): qty 100

## 2019-10-25 MED ORDER — METHOCARBAMOL 1000 MG/10ML IJ SOLN: 500.0000 mg | Freq: Four times a day (QID) | INTRAVENOUS | Status: DC | PRN

## 2019-10-25 MED ORDER — HYDRALAZINE HCL 20 MG/ML IJ SOLN: 10.0000 mg | INTRAMUSCULAR | Status: DC | PRN

## 2019-10-25 MED ORDER — BUPIVACAINE HCL 0.25 % IJ SOLN
INTRAMUSCULAR | Status: DC | PRN
  Administered 2019-10-25: 20 mL

## 2019-10-25 MED ORDER — MAGNESIUM CITRATE PO SOLN: 1.0000 | Freq: Once | ORAL | Status: DC | PRN

## 2019-10-25 MED ORDER — OXYCODONE HCL 5 MG PO TABS: 5.0000 mg | ORAL_TABLET | Freq: Once | ORAL | Status: DC | PRN

## 2019-10-25 MED ORDER — LACTATED RINGERS IV SOLN: INTRAVENOUS | Status: DC | PRN

## 2019-10-25 MED ORDER — ONDANSETRON HCL 4 MG/2ML IJ SOLN
INTRAMUSCULAR | Status: DC | PRN
  Administered 2019-10-25: 4 mg via INTRAVENOUS

## 2019-10-25 MED ORDER — ACETAMINOPHEN 500 MG PO TABS
1000.0000 mg | ORAL_TABLET | Freq: Four times a day (QID) | ORAL | Status: DC
  Administered 2019-10-25: 1000 mg via ORAL
  Filled 2019-10-25: qty 2

## 2019-10-25 MED ORDER — TOBRAMYCIN SULFATE 1.2 G IJ SOLR
INTRAMUSCULAR | Status: AC
  Filled 2019-10-25: qty 1.2

## 2019-10-25 MED ORDER — DEXAMETHASONE SODIUM PHOSPHATE 10 MG/ML IJ SOLN
INTRAMUSCULAR | Status: DC | PRN
  Administered 2019-10-25: 5 mg via INTRAVENOUS

## 2019-10-25 MED ORDER — MORPHINE SULFATE (PF) 4 MG/ML IV SOLN
4.0000 mg | Freq: Once | INTRAVENOUS | Status: AC
  Administered 2019-10-25: 4 mg via INTRAVENOUS
  Filled 2019-10-25: qty 1

## 2019-10-25 MED ORDER — CHLORHEXIDINE GLUCONATE 4 % EX LIQD: 60.0000 mL | Freq: Once | CUTANEOUS | Status: DC

## 2019-10-25 SURGICAL SUPPLY — 65 items
BNDG ELASTIC 4X5.8 VLCR STR LF (GAUZE/BANDAGES/DRESSINGS) ×4 IMPLANT
BNDG ELASTIC 6X5.8 VLCR STR LF (GAUZE/BANDAGES/DRESSINGS) ×4 IMPLANT
BNDG GAUZE ELAST 4 BULKY (GAUZE/BANDAGES/DRESSINGS) ×4 IMPLANT
CANISTER WOUNDNEG PRESSURE 500 (CANNISTER) ×3 IMPLANT
CHLORAPREP W/TINT 26 (MISCELLANEOUS) ×4 IMPLANT
CLOSURE WOUND 1/2 X4 (GAUZE/BANDAGES/DRESSINGS)
CNTNR URN SCR LID CUP LEK RST (MISCELLANEOUS) IMPLANT
CONNECTOR 5 IN 1 STRAIGHT STRL (MISCELLANEOUS) ×3 IMPLANT
CONT SPEC 4OZ STRL OR WHT (MISCELLANEOUS)
COVER SURGICAL LIGHT HANDLE (MISCELLANEOUS) ×8 IMPLANT
COVER WAND RF STERILE (DRAPES) ×4 IMPLANT
CUFF TOURN SGL LL 12 NO SLV (MISCELLANEOUS) IMPLANT
CUFF TOURN SGL QUICK 34 (TOURNIQUET CUFF)
CUFF TRNQT CYL 34X4.125X (TOURNIQUET CUFF) IMPLANT
DRAPE OEC MINIVIEW 54X84 (DRAPES) ×4 IMPLANT
DRAPE ORTHO SPLIT 77X108 STRL (DRAPES) ×4
DRAPE SURG ORHT 6 SPLT 77X108 (DRAPES) ×2 IMPLANT
DRAPE U-SHAPE 47X51 STRL (DRAPES) ×4 IMPLANT
DRESSING PEEL AND PLAC PRVNA20 (GAUZE/BANDAGES/DRESSINGS) ×1 IMPLANT
DRSG MEPILEX BORDER 4X8 (GAUZE/BANDAGES/DRESSINGS) IMPLANT
DRSG PAD ABDOMINAL 8X10 ST (GAUZE/BANDAGES/DRESSINGS) ×1 IMPLANT
DRSG PEEL AND PLACE PREVENA 20 (GAUZE/BANDAGES/DRESSINGS) ×4
DURAPREP 26ML APPLICATOR (WOUND CARE) ×1 IMPLANT
ELECT REM PT RETURN 9FT ADLT (ELECTROSURGICAL) ×4
ELECTRODE REM PT RTRN 9FT ADLT (ELECTROSURGICAL) ×2 IMPLANT
GAUZE SPONGE 4X4 12PLY STRL (GAUZE/BANDAGES/DRESSINGS) ×1 IMPLANT
GAUZE XEROFORM 1X8 LF (GAUZE/BANDAGES/DRESSINGS) ×1 IMPLANT
GLOVE BIOGEL PI IND STRL 8 (GLOVE) ×2 IMPLANT
GLOVE BIOGEL PI INDICATOR 8 (GLOVE) ×2
GLOVE ECLIPSE 8.0 STRL XLNG CF (GLOVE) ×11 IMPLANT
GOWN STRL REUS W/ TWL LRG LVL3 (GOWN DISPOSABLE) ×3 IMPLANT
GOWN STRL REUS W/ TWL XL LVL3 (GOWN DISPOSABLE) ×4 IMPLANT
GOWN STRL REUS W/TWL LRG LVL3 (GOWN DISPOSABLE) ×2
GOWN STRL REUS W/TWL XL LVL3 (GOWN DISPOSABLE) ×4
HANDPIECE INTERPULSE COAX TIP (DISPOSABLE) ×2
KIT BASIN OR (CUSTOM PROCEDURE TRAY) ×4 IMPLANT
KIT TURNOVER KIT B (KITS) ×4 IMPLANT
MANIFOLD NEPTUNE II (INSTRUMENTS) ×4 IMPLANT
NEEDLE 22X1 1/2 (OR ONLY) (NEEDLE) ×3 IMPLANT
NS IRRIG 1000ML POUR BTL (IV SOLUTION) ×4 IMPLANT
PACK ORTHO EXTREMITY (CUSTOM PROCEDURE TRAY) ×4 IMPLANT
PAD ARMBOARD 7.5X6 YLW CONV (MISCELLANEOUS) ×8 IMPLANT
PAD CAST 4YDX4 CTTN HI CHSV (CAST SUPPLIES) ×2 IMPLANT
PADDING CAST COTTON 4X4 STRL (CAST SUPPLIES) ×2
PADDING CAST COTTON 6X4 STRL (CAST SUPPLIES) ×4 IMPLANT
SET HNDPC FAN SPRY TIP SCT (DISPOSABLE) ×1 IMPLANT
SPLINT PLASTER CAST XFAST 5X30 (CAST SUPPLIES) ×1 IMPLANT
SPLINT PLASTER XFAST SET 5X30 (CAST SUPPLIES) ×2
SPONGE LAP 18X18 RF (DISPOSABLE) ×3 IMPLANT
STRIP CLOSURE SKIN 1/2X4 (GAUZE/BANDAGES/DRESSINGS) ×1 IMPLANT
SUCTION FRAZIER HANDLE 10FR (MISCELLANEOUS)
SUCTION TUBE FRAZIER 10FR DISP (MISCELLANEOUS) ×1 IMPLANT
SUT ETHILON 2 0 FS 18 (SUTURE) ×13 IMPLANT
SUT VIC AB 2-0 CT1 27 (SUTURE)
SUT VIC AB 2-0 CT1 TAPERPNT 27 (SUTURE) ×1 IMPLANT
SWAB CULTURE ESWAB REG 1ML (MISCELLANEOUS) IMPLANT
SYR CONTROL 10ML LL (SYRINGE) ×3 IMPLANT
TOWEL GREEN STERILE (TOWEL DISPOSABLE) ×4 IMPLANT
TOWEL GREEN STERILE FF (TOWEL DISPOSABLE) ×4 IMPLANT
TUBE CONNECTING 12'X1/4 (SUCTIONS) ×1
TUBE CONNECTING 12X1/4 (SUCTIONS) ×3 IMPLANT
TUBING BULK SUCTION (MISCELLANEOUS) ×3 IMPLANT
UNDERPAD 30X30 (UNDERPADS AND DIAPERS) ×4 IMPLANT
WATER STERILE IRR 1000ML POUR (IV SOLUTION) ×4 IMPLANT
YANKAUER SUCT BULB TIP NO VENT (SUCTIONS) ×4 IMPLANT
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
## 2019-10-25 NOTE — Consult Note (Signed)
With the help of an interpreter we discussed the case again in the preoperative area.  Patient's husband is present as well.  She has an open right elbow fracture with a triceps avulsion.  She is at high risk for infection, stiffness and wound healing issues secondary to her contaminated wound.  If her wound is appropriate we will perform a all suture based triceps repair using bone tunnels versus an incision and debridement alone.  She understands was a high risk for need for more surgeries.  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  All questions were answered the patient satisfaction.

## 2019-10-25 NOTE — Transfer of Care (Signed)
Immediate Anesthesia Transfer of Care Note  Patient: Latasha Powell  Procedure(s) Performed: IRRIGATION AND DEBRIDEMENT EXTREMITY (Right Elbow) TRICEPS TENDON REPAIR (Right Elbow)  Patient Location: PACU  Anesthesia Type:General  Level of Consciousness: drowsy and patient cooperative  Airway & Oxygen Therapy: Patient Spontanous Breathing  Post-op Assessment: Report given to RN and Post -op Vital signs reviewed and stable  Post vital signs: Reviewed and stable  Last Vitals:  Vitals Value Taken Time  BP 114/81 10/25/19 2129  Temp 36.9 C 10/25/19 2114  Pulse 80 10/25/19 2135  Resp 17 10/25/19 2135  SpO2 99 % 10/25/19 2135  Vitals shown include unvalidated device data.  Last Pain:  Vitals:   10/25/19 2114  TempSrc:   PainSc: Asleep         Complications: No apparent anesthesia complications
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
## 2019-10-25 NOTE — Interval H&P Note (Signed)
History and Physical Interval Note:  10/25/2019 7:21 PM  Latasha Powell  has presented today for surgery, with the diagnosis of open right elbow fx.  The various methods of treatment have been discussed with the patient and family. After consideration of risks, benefits and other options for treatment, the patient has consented to  Procedure(s): IRRIGATION AND DEBRIDEMENT EXTREMITY (Right) TRICEPS TENDON REPAIR (Right) as a surgical intervention.  The patient's history has been reviewed, patient examined, no change in status, stable for surgery.  I have reviewed the patient's chart and labs.  Questions were answered to the patient's satisfaction.     Bjorn Pippin

## 2019-10-25 NOTE — Progress Notes (Signed)
Orthopedic Tech Progress Note Patient Details:  Latasha Powell [birth year] [id number]  Ortho Devices Ortho Device/Splint Location: Level 2 trauma alert       Saul Fordyce 10/25/2019, 11:59 AM
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
## 2019-10-25 NOTE — Anesthesia Procedure Notes (Signed)
Procedure Name: Intubation Date/Time: 10/25/2019 7:38 PM Performed by: Oletta Lamas, CRNA Pre-anesthesia Checklist: Patient identified, Emergency Drugs available, Suction available and Patient being monitored Patient Re-evaluated:Patient Re-evaluated prior to induction Oxygen Delivery Method: Circle System Utilized Preoxygenation: Pre-oxygenation with 100% oxygen Induction Type: IV induction and Rapid sequence Laryngoscope Size: Mac and 4 Grade View: Grade I Tube type: Oral Tube size: 7.0 mm Number of attempts: 1 Airway Equipment and Method: Stylet and Oral airway Placement Confirmation: ETT inserted through vocal cords under direct vision,  positive ETCO2 and breath sounds checked- equal and bilateral Secured at: 22 cm Tube secured with: Tape Dental Injury: Teeth and Oropharynx as per pre-operative assessment
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
## 2019-10-26 ENCOUNTER — Observation Stay (HOSPITAL_COMMUNITY): Payer: 59

## 2019-10-26 ENCOUNTER — Other Ambulatory Visit: Payer: Self-pay

## 2019-10-26 LAB — BASIC METABOLIC PANEL
Anion gap: 11 (ref 5–15)
BUN: 12 mg/dL (ref 6–20)
CO2: 21 mmol/L — ABNORMAL LOW (ref 22–32)
Calcium: 8.5 mg/dL — ABNORMAL LOW (ref 8.9–10.3)
Chloride: 104 mmol/L (ref 98–111)
Creatinine, Ser: 0.72 mg/dL (ref 0.44–1.00)
GFR calc Af Amer: >60 mL/min (ref >60)
GFR calc non Af Amer: >60 mL/min (ref >60)
Glucose, Bld: 136 mg/dL — ABNORMAL HIGH (ref 70–99)
Potassium: 3.7 mmol/L (ref 3.5–5.1)
Sodium: 136 mmol/L (ref 135–145)

## 2019-10-26 LAB — CBC
HCT: 37 % (ref 36.0–46.0)
Hemoglobin: 12.1 g/dL (ref 12.0–15.0)
MCH: 29.6 pg (ref 26.0–34.0)
MCHC: 32.7 g/dL (ref 30.0–36.0)
MCV: 90.5 fL (ref 80.0–100.0)
Platelets: 271 10*3/uL (ref 150–400)
RBC: 4.09 MIL/uL (ref 3.87–5.11)
RDW: 12.8 % (ref 11.5–15.5)
WBC: 13.3 10*3/uL — ABNORMAL HIGH (ref 4.0–10.5)
nRBC: 0 % (ref 0.0–0.2)

## 2019-10-26 LAB — HIV ANTIBODY (ROUTINE TESTING W REFLEX): HIV Screen 4th Generation wRfx: NONREACTIVE

## 2019-10-26 MED ORDER — HYDROMORPHONE HCL 1 MG/ML IJ SOLN
0.5000 mg | Freq: Four times a day (QID) | INTRAMUSCULAR | Status: DC | PRN
  Administered 2019-10-27 – 2019-10-30 (×6): 0.5 mg via INTRAVENOUS
  Filled 2019-10-26 (×6): qty 1

## 2019-10-26 MED ORDER — METHOCARBAMOL 1000 MG/10ML IJ SOLN
1000.0000 mg | Freq: Three times a day (TID) | INTRAVENOUS | Status: DC
  Administered 2019-10-27: 1000 mg via INTRAVENOUS
  Filled 2019-10-26 (×18): qty 10

## 2019-10-26 MED ORDER — OXYCODONE HCL 5 MG PO TABS
5.0000 mg | ORAL_TABLET | ORAL | Status: DC | PRN
  Administered 2019-10-26: 5 mg via ORAL
  Administered 2019-10-27 – 2019-10-28 (×2): 10 mg via ORAL
  Filled 2019-10-26 (×2): qty 2
  Filled 2019-10-26: qty 1

## 2019-10-26 MED ORDER — METHOCARBAMOL 500 MG PO TABS
1000.0000 mg | ORAL_TABLET | Freq: Three times a day (TID) | ORAL | Status: DC
  Administered 2019-10-26 – 2019-10-31 (×14): 1000 mg via ORAL
  Filled 2019-10-26 (×13): qty 2

## 2019-10-26 MED ORDER — ENOXAPARIN SODIUM 30 MG/0.3ML ~~LOC~~ SOLN
30.0000 mg | Freq: Two times a day (BID) | SUBCUTANEOUS | Status: DC
  Administered 2019-10-26 – 2019-10-30 (×8): 30 mg via SUBCUTANEOUS
  Filled 2019-10-26 (×9): qty 0.3

## 2019-10-26 MED ORDER — ACETAMINOPHEN 500 MG PO TABS
1000.0000 mg | ORAL_TABLET | Freq: Four times a day (QID) | ORAL | Status: DC
  Administered 2019-10-27 – 2019-10-31 (×13): 1000 mg via ORAL
  Filled 2019-10-26 (×17): qty 2

## 2019-10-26 NOTE — Anesthesia Postprocedure Evaluation (Signed)
Anesthesia Post Note  Patient: Latasha Powell  Procedure(s) Performed: Right elbow joint incision debridement with excisional debridement of bone and tendon as well as subcutaneous tissue.  Right deep wound incision debridement with excisional debridement of skin ,muscle, and fascia.  (Right Elbow)     Patient location during evaluation: PACU Anesthesia Type: General Level of consciousness: awake and alert Pain management: pain level controlled Vital Signs Assessment: post-procedure vital signs reviewed and stable Respiratory status: spontaneous breathing, nonlabored ventilation, respiratory function stable and patient connected to nasal cannula oxygen Cardiovascular status: blood pressure returned to baseline and stable Postop Assessment: no apparent nausea or vomiting Anesthetic complications: no    Last Vitals:  Vitals:   10/25/19 2318 10/26/19 0407  BP: 120/76 96/61  Pulse: 85 76  Resp: 18 18  Temp: 37.4 C 36.9 C  SpO2: 97% 99%    Last Pain:  Vitals:   10/26/19 0407  TempSrc: Oral  PainSc:                  Catheryn Bacon Labaron Digirolamo
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
## 2019-10-26 NOTE — Social Work (Signed)
CSW met with pt at bedside. CSW introduced herself and reviewed her role. CSW completed sbirt with pt. Pt denied alcohol use and substance use. Pt does not need resources at this time.   Emeterio Reeve, Latanya Presser, [place] Licensed Clinical Social Worker [phone]

## 2019-10-26 NOTE — Progress Notes (Signed)
PT Cancellation Note  Patient Details Name: Latasha Powell MRN: [id number] DOB: 1990/09/20   Cancelled Treatment:    Reason Eval/Treat Not Completed: PT screened, no needs identified, will sign off     Lillia Pauls, PT, DPT Acute Rehabilitation Services Pager [phone] Office [phone]    [name] 10/26/2019, 11:58 AM

## 2019-10-26 NOTE — Plan of Care (Signed)
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
## 2019-10-26 NOTE — Progress Notes (Signed)
Orthopedic Tech Progress Note Patient Details:  Latasha Powell 31-Dec-1990 [id number] Patient was not able to bend her arm much at all and was not able to use sling. Sling was left in the patients room below the TV.  Ortho Devices Type of Ortho Device: Shoulder immobilizer Ortho Device/Splint Location: RUE Ortho Device/Splint Interventions: Ordered   Post Interventions Patient Tolerated: Unable to use device properly Instructions Provided: Adjustment of device   Tiandre Teall E Farin Buhman 10/26/2019, 12:05 AM
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
## 2019-10-27 DIAGNOSIS — Z9181 History of falling: Secondary | ICD-10-CM | POA: Diagnosis not present

## 2019-10-27 DIAGNOSIS — S060X0A Concussion without loss of consciousness, initial encounter: Secondary | ICD-10-CM | POA: Diagnosis present

## 2019-10-27 DIAGNOSIS — S52021B Displaced fracture of olecranon process without intraarticular extension of right ulna, initial encounter for open fracture type I or II: Secondary | ICD-10-CM | POA: Diagnosis present

## 2019-10-27 DIAGNOSIS — R319 Hematuria, unspecified: Secondary | ICD-10-CM | POA: Diagnosis not present

## 2019-10-27 DIAGNOSIS — S46311A Strain of muscle, fascia and tendon of triceps, right arm, initial encounter: Secondary | ICD-10-CM | POA: Diagnosis present

## 2019-10-27 DIAGNOSIS — S21101A Unspecified open wound of right front wall of thorax without penetration into thoracic cavity, initial encounter: Secondary | ICD-10-CM | POA: Diagnosis present

## 2019-10-27 DIAGNOSIS — Z793 Long term (current) use of hormonal contraceptives: Secondary | ICD-10-CM | POA: Diagnosis not present

## 2019-10-27 DIAGNOSIS — Z20822 Contact with and (suspected) exposure to covid-19: Secondary | ICD-10-CM | POA: Diagnosis present

## 2019-10-27 DIAGNOSIS — S40211A Abrasion of right shoulder, initial encounter: Secondary | ICD-10-CM | POA: Diagnosis present

## 2019-10-27 DIAGNOSIS — T1490XA Injury, unspecified, initial encounter: Secondary | ICD-10-CM | POA: Diagnosis present

## 2019-10-27 DIAGNOSIS — Y92410 Unspecified street and highway as the place of occurrence of the external cause: Secondary | ICD-10-CM | POA: Diagnosis not present

## 2019-10-27 DIAGNOSIS — N898 Other specified noninflammatory disorders of vagina: Secondary | ICD-10-CM | POA: Diagnosis not present

## 2019-10-27 DIAGNOSIS — M25551 Pain in right hip: Secondary | ICD-10-CM | POA: Diagnosis not present

## 2019-10-27 DIAGNOSIS — Z87891 Personal history of nicotine dependence: Secondary | ICD-10-CM | POA: Diagnosis not present

## 2019-10-27 DIAGNOSIS — S80211A Abrasion, right knee, initial encounter: Secondary | ICD-10-CM | POA: Diagnosis present

## 2019-10-27 DIAGNOSIS — Z23 Encounter for immunization: Secondary | ICD-10-CM | POA: Diagnosis not present

## 2019-10-27 DIAGNOSIS — S270XXA Traumatic pneumothorax, initial encounter: Secondary | ICD-10-CM | POA: Diagnosis present

## 2019-10-27 LAB — CBC
HCT: 31.6 % — ABNORMAL LOW (ref 36.0–46.0)
Hemoglobin: 10.2 g/dL — ABNORMAL LOW (ref 12.0–15.0)
MCH: 29.2 pg (ref 26.0–34.0)
MCHC: 32.3 g/dL (ref 30.0–36.0)
MCV: 90.5 fL (ref 80.0–100.0)
Platelets: 232 10*3/uL (ref 150–400)
RBC: 3.49 MIL/uL — ABNORMAL LOW (ref 3.87–5.11)
RDW: 12.9 % (ref 11.5–15.5)
WBC: 10.7 10*3/uL — ABNORMAL HIGH (ref 4.0–10.5)
nRBC: 0 % (ref 0.0–0.2)

## 2019-10-27 LAB — BASIC METABOLIC PANEL
Anion gap: 7 (ref 5–15)
BUN: 8 mg/dL (ref 6–20)
CO2: 22 mmol/L (ref 22–32)
Calcium: 8 mg/dL — ABNORMAL LOW (ref 8.9–10.3)
Chloride: 109 mmol/L (ref 98–111)
Creatinine, Ser: 0.66 mg/dL (ref 0.44–1.00)
GFR calc Af Amer: >60 mL/min (ref >60)
GFR calc non Af Amer: >60 mL/min (ref >60)
Glucose, Bld: 101 mg/dL — ABNORMAL HIGH (ref 70–99)
Potassium: 3.7 mmol/L (ref 3.5–5.1)
Sodium: 138 mmol/L (ref 135–145)

## 2019-10-27 LAB — MAGNESIUM: Magnesium: 1.9 mg/dL (ref 1.7–2.4)

## 2019-10-27 LAB — PHOSPHORUS: Phosphorus: 2.3 mg/dL — ABNORMAL LOW (ref 2.5–4.6)

## 2019-10-27 MED ORDER — LORAZEPAM 1 MG PO TABS
1.0000 mg | ORAL_TABLET | ORAL | Status: DC | PRN
  Administered 2019-10-27 – 2019-10-30 (×3): 1 mg via ORAL
  Filled 2019-10-27 (×3): qty 1

## 2019-10-27 NOTE — Plan of Care (Signed)
°  Problem: Education: °Goal: Knowledge of General Education information will improve °Description: Including pain rating scale, medication(s)/side effects and non-pharmacologic comfort measures °Outcome: Progressing °  °Problem: Clinical Measurements: °Goal: Will remain free from infection °Outcome: Progressing °  °Problem: Activity: °Goal: Risk for activity intolerance will decrease °Outcome: Progressing °  °Problem: Pain Managment: °Goal: General experience of comfort will improve °Outcome: Progressing °  °Problem: Skin Integrity: °Goal: Risk for impaired skin integrity will decrease °Outcome: Progressing °  °

## 2019-10-27 NOTE — Plan of Care (Signed)

## 2019-10-27 NOTE — Progress Notes (Signed)
Patient refused wound care at this time. Patient stated that  " I want to sleep, not now". Pt encouraged. Will report to on-coming RN.
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
## 2019-10-28 LAB — CBC
HCT: 33 % — ABNORMAL LOW (ref 36.0–46.0)
Hemoglobin: 11 g/dL — ABNORMAL LOW (ref 12.0–15.0)
MCH: 29.8 pg (ref 26.0–34.0)
MCHC: 33.3 g/dL (ref 30.0–36.0)
MCV: 89.4 fL (ref 80.0–100.0)
Platelets: 256 10*3/uL (ref 150–400)
RBC: 3.69 MIL/uL — ABNORMAL LOW (ref 3.87–5.11)
RDW: 12.7 % (ref 11.5–15.5)
WBC: 8.2 10*3/uL (ref 4.0–10.5)
nRBC: 0 % (ref 0.0–0.2)

## 2019-10-28 LAB — BASIC METABOLIC PANEL
Anion gap: 8 (ref 5–15)
BUN: 12 mg/dL (ref 6–20)
CO2: 23 mmol/L (ref 22–32)
Calcium: 8.8 mg/dL — ABNORMAL LOW (ref 8.9–10.3)
Chloride: 107 mmol/L (ref 98–111)
Creatinine, Ser: 0.65 mg/dL (ref 0.44–1.00)
GFR calc Af Amer: >60 mL/min (ref >60)
GFR calc non Af Amer: >60 mL/min (ref >60)
Glucose, Bld: 109 mg/dL — ABNORMAL HIGH (ref 70–99)
Potassium: 3.6 mmol/L (ref 3.5–5.1)
Sodium: 138 mmol/L (ref 135–145)

## 2019-10-28 LAB — PHOSPHORUS: Phosphorus: 3.4 mg/dL (ref 2.5–4.6)

## 2019-10-28 LAB — SURGICAL PCR SCREEN
MRSA, PCR: NEGATIVE
Staphylococcus aureus: NEGATIVE

## 2019-10-28 LAB — MAGNESIUM: Magnesium: 1.9 mg/dL (ref 1.7–2.4)
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
## 2019-10-28 NOTE — Progress Notes (Signed)
    Subjective: Visit assisted by telehealth Spanish interpreter.  Patient reports pain as moderate.  Understands plan for surgery tomorrow.  Objective:   VITALS:   Vitals:   10/27/19 1350 10/27/19 1945 10/28/19 0326 10/28/19 0721  BP: 115/81 110/77 110/75 105/73  Pulse: 86 79 73 63  Resp: 17 17 17 15   Temp: 98.3 F (36.8 C) 98.7 F (37.1 C) 98.3 F (36.8 C) 98.4 F (36.9 C)  TempSrc: Oral Oral Oral Oral  SpO2: 97% 100% 100% 100%  Weight:      Height:       CBC Latest Ref Rng & Units 10/28/2019 10/27/2019 10/26/2019  WBC 4.0 - 10.5 K/uL 8.2 10.7(H) 13.3(H)  Hemoglobin 12.0 - 15.0 g/dL 11.0(L) 10.2(L) 12.1  Hematocrit 36.0 - 46.0 % 33.0(L) 31.6(L) 37.0  Platelets 150 - 400 K/uL 256 232 271   BMP Latest Ref Rng & Units 10/28/2019 10/27/2019 10/26/2019  Glucose 70 - 99 mg/dL 12/26/2019) 045(W) 098(J)  BUN 6 - 20 mg/dL 12 8 12   Creatinine 0.44 - 1.00 mg/dL 191(Y 7.82  Sodium 135 - 145 mmol/L 138 138 136  Potassium 3.5 - 5.1 mmol/L 3.6 3.7 3.7  Chloride 98 - 111 mmol/L 107 109 104  CO2 22 - 32 mmol/L 23 22 21(L)  Calcium 8.9 - 10.3 mg/dL 9.56) 2.13) 0.8(M)   Intake/Output      04/10 0701 - 04/11 0700 04/11 0701 - 04/12 0700   P.O. 960    I.V. (mL/kg) 365.9 (6)    IV Piggyback 100    Total Intake(mL/kg) 1425.9 (23.3)    Urine (mL/kg/hr)     Drains     Total Output     Net +1425.9         Urine Occurrence 6 x       Physical Exam: General: NAD.  Supine in bed.  Calm, conversant. MSK RUE: Splint and wound VAC in place and in good condition Hand warm.  Wiggles fingers.  Decent grip. Sensation intact distally   Assessment: 3 Days Post-Op  S/P Procedure(s) (LRB): Right elbow joint incision debridement with excisional debridement of bone and tendon as well as subcutaneous tissue.  Right deep wound incision debridement with excisional debridement of skin ,muscle, and fascia.  (Right) by Dr. 06/11  Active Problems:   Open fracture of right elbow   Right open elbow  fracture with triceps avulsion and gross contamination Stable for planned surgery tomorrow  Plan: Incentive Spirometry Elevate Ice as needed  Weightbearing: NWB RUE Insicional and dressing care: Maintain wound VAC Orthopedic device(s): Splint Showering: Keep dressing dry  Dispo: Surgery tomorrow with Dr. 06/12 right elbow    Everardo Pacific III, PA-C 10/28/2019, 9:56 AM

## 2019-10-28 NOTE — Plan of Care (Signed)
  Problem: Health Behavior/Discharge Planning: Goal: Ability to manage health-related needs will improve Outcome: Progressing   Problem: Activity: Goal: Risk for activity intolerance will decrease Outcome: Progressing   

## 2019-10-29 ENCOUNTER — Inpatient Hospital Stay (HOSPITAL_COMMUNITY): Payer: 59 | Admitting: Certified Registered Nurse Anesthetist

## 2019-10-29 ENCOUNTER — Encounter (HOSPITAL_COMMUNITY): Payer: Self-pay

## 2019-10-29 ENCOUNTER — Encounter (HOSPITAL_COMMUNITY): Admission: EM | Disposition: A | Discharge status: Home / Self Care | Payer: Self-pay | Source: Home / Self Care

## 2019-10-29 ENCOUNTER — Inpatient Hospital Stay (HOSPITAL_COMMUNITY): Payer: 59

## 2019-10-29 DIAGNOSIS — S21101A Unspecified open wound of right front wall of thorax without penetration into thoracic cavity, initial encounter: Secondary | ICD-10-CM | POA: Diagnosis not present

## 2019-10-29 DIAGNOSIS — Z20822 Contact with and (suspected) exposure to covid-19: Secondary | ICD-10-CM | POA: Diagnosis not present

## 2019-10-29 DIAGNOSIS — S52021B Displaced fracture of olecranon process without intraarticular extension of right ulna, initial encounter for open fracture type I or II: Secondary | ICD-10-CM | POA: Diagnosis not present

## 2019-10-29 DIAGNOSIS — S270XXA Traumatic pneumothorax, initial encounter: Secondary | ICD-10-CM | POA: Diagnosis not present

## 2019-10-29 HISTORY — PX: I & D EXTREMITY: SHX5045

## 2019-10-29 HISTORY — PX: TRICEPS TENDON REPAIR: SHX2577

## 2019-10-29 LAB — CBC
HCT: 36.7 % (ref 36.0–46.0)
Hemoglobin: 12.1 g/dL (ref 12.0–15.0)
MCH: 29.6 pg (ref 26.0–34.0)
MCHC: 33 g/dL (ref 30.0–36.0)
MCV: 89.7 fL (ref 80.0–100.0)
Platelets: 345 10*3/uL (ref 150–400)
RBC: 4.09 MIL/uL (ref 3.87–5.11)
RDW: 12.6 % (ref 11.5–15.5)
WBC: 6.9 10*3/uL (ref 4.0–10.5)
nRBC: 0 % (ref 0.0–0.2)

## 2019-10-29 LAB — BASIC METABOLIC PANEL
Anion gap: 8 (ref 5–15)
BUN: 10 mg/dL (ref 6–20)
CO2: 22 mmol/L (ref 22–32)
Calcium: 8.8 mg/dL — ABNORMAL LOW (ref 8.9–10.3)
Chloride: 108 mmol/L (ref 98–111)
Creatinine, Ser: 0.55 mg/dL (ref 0.44–1.00)
GFR calc Af Amer: >60 mL/min (ref >60)
GFR calc non Af Amer: >60 mL/min (ref >60)
Glucose, Bld: 91 mg/dL (ref 70–99)
Potassium: 4 mmol/L (ref 3.5–5.1)
Sodium: 138 mmol/L (ref 135–145)

## 2019-10-29 LAB — MAGNESIUM: Magnesium: 1.9 mg/dL (ref 1.7–2.4)

## 2019-10-29 LAB — PHOSPHORUS: Phosphorus: 4 mg/dL (ref 2.5–4.6)

## 2019-10-29 SURGERY — REPAIR, TENDON, TRICEPS
Anesthesia: General | Site: Arm Upper | Laterality: Right

## 2019-10-29 MED ORDER — CEFAZOLIN SODIUM-DEXTROSE 2-4 GM/100ML-% IV SOLN
2.0000 g | Freq: Four times a day (QID) | INTRAVENOUS | Status: AC
  Administered 2019-10-29 – 2019-10-30 (×3): 2 g via INTRAVENOUS
  Filled 2019-10-29 (×3): qty 100

## 2019-10-29 MED ORDER — ROCURONIUM BROMIDE 10 MG/ML (PF) SYRINGE
PREFILLED_SYRINGE | INTRAVENOUS | Status: DC | PRN
  Administered 2019-10-29: 70 mg via INTRAVENOUS

## 2019-10-29 MED ORDER — MIDAZOLAM HCL 2 MG/2ML IJ SOLN
INTRAMUSCULAR | Status: AC
  Filled 2019-10-29: qty 2

## 2019-10-29 MED ORDER — BUPIVACAINE HCL 0.25 % IJ SOLN
INTRAMUSCULAR | Status: DC | PRN
  Administered 2019-10-29: 20 mL

## 2019-10-29 MED ORDER — FENTANYL CITRATE (PF) 100 MCG/2ML IJ SOLN
25.0000 ug | INTRAMUSCULAR | Status: DC | PRN
  Administered 2019-10-29 (×2): 50 ug via INTRAVENOUS

## 2019-10-29 MED ORDER — PROPOFOL 10 MG/ML IV BOLUS
INTRAVENOUS | Status: DC | PRN
  Administered 2019-10-29: 130 mg via INTRAVENOUS

## 2019-10-29 MED ORDER — DEXAMETHASONE SODIUM PHOSPHATE 10 MG/ML IJ SOLN
INTRAMUSCULAR | Status: AC
  Filled 2019-10-29: qty 1

## 2019-10-29 MED ORDER — ONDANSETRON HCL 4 MG/2ML IJ SOLN
INTRAMUSCULAR | Status: AC
  Filled 2019-10-29: qty 2

## 2019-10-29 MED ORDER — NAPROXEN 250 MG PO TABS
250.0000 mg | ORAL_TABLET | Freq: Two times a day (BID) | ORAL | Status: DC
  Administered 2019-10-29 – 2019-10-31 (×4): 250 mg via ORAL
  Filled 2019-10-29 (×5): qty 1

## 2019-10-29 MED ORDER — VANCOMYCIN HCL 1000 MG IV SOLR
INTRAVENOUS | Status: DC | PRN
  Administered 2019-10-29: 1000 mg

## 2019-10-29 MED ORDER — OXYCODONE HCL 5 MG/5ML PO SOLN: 5.0000 mg | Freq: Once | ORAL | Status: AC | PRN

## 2019-10-29 MED ORDER — FENTANYL CITRATE (PF) 250 MCG/5ML IJ SOLN
INTRAMUSCULAR | Status: AC
  Filled 2019-10-29: qty 5

## 2019-10-29 MED ORDER — 0.9 % SODIUM CHLORIDE (POUR BTL) OPTIME
TOPICAL | Status: DC | PRN
  Administered 2019-10-29: 1000 mL

## 2019-10-29 MED ORDER — TOBRAMYCIN SULFATE 1.2 G IJ SOLR
INTRAMUSCULAR | Status: DC | PRN
  Administered 2019-10-29: 1.2 g

## 2019-10-29 MED ORDER — SUGAMMADEX SODIUM 200 MG/2ML IV SOLN
INTRAVENOUS | Status: DC | PRN
  Administered 2019-10-29: 125 mg via INTRAVENOUS

## 2019-10-29 MED ORDER — CEFAZOLIN SODIUM-DEXTROSE 1-4 GM/50ML-% IV SOLN: 1.0000 g | Freq: Four times a day (QID) | INTRAVENOUS | Status: DC

## 2019-10-29 MED ORDER — PHENYLEPHRINE 40 MCG/ML (10ML) SYRINGE FOR IV PUSH (FOR BLOOD PRESSURE SUPPORT)
PREFILLED_SYRINGE | INTRAVENOUS | Status: AC
  Filled 2019-10-29: qty 10

## 2019-10-29 MED ORDER — CEFAZOLIN SODIUM-DEXTROSE 2-4 GM/100ML-% IV SOLN
INTRAVENOUS | Status: AC
  Filled 2019-10-29: qty 100

## 2019-10-29 MED ORDER — HYDROMORPHONE HCL 1 MG/ML IJ SOLN
INTRAMUSCULAR | Status: AC
  Filled 2019-10-29: qty 1

## 2019-10-29 MED ORDER — OXYCODONE HCL 5 MG PO TABS
ORAL_TABLET | ORAL | Status: AC
  Filled 2019-10-29: qty 1

## 2019-10-29 MED ORDER — ACETAMINOPHEN 10 MG/ML IV SOLN
1000.0000 mg | Freq: Once | INTRAVENOUS | Status: DC | PRN
  Administered 2019-10-29: 1000 mg via INTRAVENOUS

## 2019-10-29 MED ORDER — SODIUM CHLORIDE 0.9 % IR SOLN
Status: DC | PRN
  Administered 2019-10-29: 3000 mL

## 2019-10-29 MED ORDER — BUPIVACAINE HCL (PF) 0.25 % IJ SOLN
INTRAMUSCULAR | Status: AC
  Filled 2019-10-29: qty 30

## 2019-10-29 MED ORDER — LIDOCAINE 2% (20 MG/ML) 5 ML SYRINGE
INTRAMUSCULAR | Status: DC | PRN
  Administered 2019-10-29: 60 mg via INTRAVENOUS

## 2019-10-29 MED ORDER — FENTANYL CITRATE (PF) 250 MCG/5ML IJ SOLN
INTRAMUSCULAR | Status: DC | PRN
  Administered 2019-10-29 (×2): 50 ug via INTRAVENOUS
  Administered 2019-10-29: 100 ug via INTRAVENOUS
  Administered 2019-10-29: 50 ug via INTRAVENOUS

## 2019-10-29 MED ORDER — DEXAMETHASONE SODIUM PHOSPHATE 10 MG/ML IJ SOLN
INTRAMUSCULAR | Status: DC | PRN
  Administered 2019-10-29: 4 mg via INTRAVENOUS

## 2019-10-29 MED ORDER — MIDAZOLAM HCL 5 MG/5ML IJ SOLN
INTRAMUSCULAR | Status: DC | PRN
  Administered 2019-10-29: 2 mg via INTRAVENOUS

## 2019-10-29 MED ORDER — ACETAMINOPHEN 160 MG/5ML PO SOLN: 1000.0000 mg | Freq: Once | ORAL | Status: DC | PRN

## 2019-10-29 MED ORDER — HYDROMORPHONE HCL 1 MG/ML IJ SOLN
0.2500 mg | INTRAMUSCULAR | Status: DC | PRN
  Administered 2019-10-29 (×4): 0.5 mg via INTRAVENOUS

## 2019-10-29 MED ORDER — HYDROMORPHONE HCL 1 MG/ML IJ SOLN
INTRAMUSCULAR | Status: DC | PRN
  Administered 2019-10-29 (×2): .5 mg via INTRAVENOUS

## 2019-10-29 MED ORDER — VANCOMYCIN HCL 1000 MG IV SOLR
INTRAVENOUS | Status: AC
  Filled 2019-10-29: qty 1000

## 2019-10-29 MED ORDER — FENTANYL CITRATE (PF) 100 MCG/2ML IJ SOLN
INTRAMUSCULAR | Status: AC
  Filled 2019-10-29: qty 2

## 2019-10-29 MED ORDER — LACTATED RINGERS IV SOLN: INTRAVENOUS | Status: DC | PRN

## 2019-10-29 MED ORDER — CEFAZOLIN SODIUM-DEXTROSE 2-4 GM/100ML-% IV SOLN
2.0000 g | INTRAVENOUS | Status: AC
  Administered 2019-10-29: 2 g via INTRAVENOUS

## 2019-10-29 MED ORDER — TOBRAMYCIN SULFATE 1.2 G IJ SOLR
INTRAMUSCULAR | Status: AC
  Filled 2019-10-29: qty 1.2

## 2019-10-29 MED ORDER — ACETAMINOPHEN 500 MG PO TABS: 1000.0000 mg | ORAL_TABLET | Freq: Once | ORAL | Status: DC | PRN

## 2019-10-29 MED ORDER — OXYCODONE HCL 5 MG PO TABS
5.0000 mg | ORAL_TABLET | Freq: Once | ORAL | Status: AC | PRN
  Administered 2019-10-29: 5 mg via ORAL

## 2019-10-29 MED ORDER — LIDOCAINE 2% (20 MG/ML) 5 ML SYRINGE
INTRAMUSCULAR | Status: AC
  Filled 2019-10-29: qty 5

## 2019-10-29 MED ORDER — METHOCARBAMOL 500 MG PO TABS
ORAL_TABLET | ORAL | Status: AC
  Filled 2019-10-29: qty 2

## 2019-10-29 MED ORDER — ONDANSETRON HCL 4 MG/2ML IJ SOLN
INTRAMUSCULAR | Status: DC | PRN
  Administered 2019-10-29: 4 mg via INTRAVENOUS

## 2019-10-29 SURGICAL SUPPLY — 72 items
ANCHOR PEEK CORKSCREW 4.5 (Anchor) ×1 IMPLANT
ANCHOR PEEK CORKSCREW 4.5MM (Anchor) ×1 IMPLANT
BENZOIN TINCTURE PRP APPL 2/3 (GAUZE/BANDAGES/DRESSINGS) ×2 IMPLANT
BNDG ELASTIC 4X5.8 VLCR STR LF (GAUZE/BANDAGES/DRESSINGS) ×5 IMPLANT
BNDG ELASTIC 6X5.8 VLCR STR LF (GAUZE/BANDAGES/DRESSINGS) ×3 IMPLANT
BNDG GAUZE ELAST 4 BULKY (GAUZE/BANDAGES/DRESSINGS) ×3 IMPLANT
CHLORAPREP W/TINT 26 (MISCELLANEOUS) ×3 IMPLANT
CLOSURE WOUND 1/2 X4 (GAUZE/BANDAGES/DRESSINGS) ×1
CNTNR URN SCR LID CUP LEK RST (MISCELLANEOUS) IMPLANT
CONT SPEC 4OZ STRL OR WHT (MISCELLANEOUS)
COVER SURGICAL LIGHT HANDLE (MISCELLANEOUS) ×6 IMPLANT
COVER WAND RF STERILE (DRAPES) ×1 IMPLANT
CUFF TOURN SGL LL 12 NO SLV (MISCELLANEOUS) IMPLANT
CUFF TOURN SGL QUICK 34 (TOURNIQUET CUFF)
CUFF TRNQT CYL 34X4.125X (TOURNIQUET CUFF) IMPLANT
DRAPE OEC MINIVIEW 54X84 (DRAPES) ×1 IMPLANT
DRAPE U-SHAPE 47X51 STRL (DRAPES) ×3 IMPLANT
DRSG AQUACEL AG ADV 3.5X10 (GAUZE/BANDAGES/DRESSINGS) ×2 IMPLANT
DRSG MEPILEX BORDER 4X8 (GAUZE/BANDAGES/DRESSINGS) IMPLANT
DRSG PAD ABDOMINAL 8X10 ST (GAUZE/BANDAGES/DRESSINGS) ×1 IMPLANT
DURAPREP 26ML APPLICATOR (WOUND CARE) ×3 IMPLANT
ELECT REM PT RETURN 9FT ADLT (ELECTROSURGICAL) ×3
ELECTRODE REM PT RTRN 9FT ADLT (ELECTROSURGICAL) ×1 IMPLANT
GAUZE SPONGE 4X4 12PLY STRL (GAUZE/BANDAGES/DRESSINGS) ×3 IMPLANT
GAUZE XEROFORM 1X8 LF (GAUZE/BANDAGES/DRESSINGS) ×3 IMPLANT
GLOVE BIOGEL PI IND STRL 7.5 (GLOVE) IMPLANT
GLOVE BIOGEL PI IND STRL 8 (GLOVE) ×1 IMPLANT
GLOVE BIOGEL PI INDICATOR 7.5 (GLOVE) ×2
GLOVE BIOGEL PI INDICATOR 8 (GLOVE) ×2
GLOVE ECLIPSE 8.0 STRL XLNG CF (GLOVE) ×10 IMPLANT
GOWN STRL REUS W/ TWL LRG LVL3 (GOWN DISPOSABLE) ×2 IMPLANT
GOWN STRL REUS W/ TWL XL LVL3 (GOWN DISPOSABLE) ×2 IMPLANT
GOWN STRL REUS W/TWL LRG LVL3 (GOWN DISPOSABLE) ×6
GOWN STRL REUS W/TWL XL LVL3 (GOWN DISPOSABLE) ×6
HANDPIECE INTERPULSE COAX TIP (DISPOSABLE) ×2
KIT BASIN OR (CUSTOM PROCEDURE TRAY) ×3 IMPLANT
KIT BIO-TENODESIS 3X8 DISP (MISCELLANEOUS) ×2
KIT DRSG PREVENA PLUS 7DAY 125 (MISCELLANEOUS) ×2 IMPLANT
KIT INSRT BABSR STRL DISP BTN (MISCELLANEOUS) IMPLANT
KIT PREVENA INCISION MGT 13 (CANNISTER) ×2 IMPLANT
KIT TURNOVER KIT B (KITS) ×3 IMPLANT
MANIFOLD NEPTUNE II (INSTRUMENTS) ×3 IMPLANT
NDL TAPERED W/ NITINOL LOOP (MISCELLANEOUS) IMPLANT
NEEDLE 22X1 1/2 (OR ONLY) (NEEDLE) ×2 IMPLANT
NEEDLE TAPERED W/ NITINOL LOOP (MISCELLANEOUS) ×3 IMPLANT
NS IRRIG 1000ML POUR BTL (IV SOLUTION) ×3 IMPLANT
PACK ORTHO EXTREMITY (CUSTOM PROCEDURE TRAY) ×3 IMPLANT
PAD ARMBOARD 7.5X6 YLW CONV (MISCELLANEOUS) ×6 IMPLANT
PAD CAST 4YDX4 CTTN HI CHSV (CAST SUPPLIES) ×1 IMPLANT
PADDING CAST COTTON 4X4 STRL (CAST SUPPLIES) ×2
PADDING CAST COTTON 6X4 STRL (CAST SUPPLIES) ×3 IMPLANT
SET HNDPC FAN SPRY TIP SCT (DISPOSABLE) IMPLANT
SPLINT PLASTER CAST XFAST 5X30 (CAST SUPPLIES) IMPLANT
SPLINT PLASTER XFAST SET 5X30 (CAST SUPPLIES) ×2
SPONGE LAP 18X18 RF (DISPOSABLE) IMPLANT
STRIP CLOSURE SKIN 1/2X4 (GAUZE/BANDAGES/DRESSINGS) ×2 IMPLANT
SUCTION FRAZIER HANDLE 10FR (MISCELLANEOUS) ×2
SUCTION TUBE FRAZIER 10FR DISP (MISCELLANEOUS) ×1 IMPLANT
SUT ETHILON 2 0 FS 18 (SUTURE) ×7 IMPLANT
SUT VIC AB 0 CT1 27 (SUTURE) ×4
SUT VIC AB 0 CT1 27XBRD ANBCTR (SUTURE) IMPLANT
SUT VIC AB 2-0 CT1 27 (SUTURE)
SUT VIC AB 2-0 CT1 TAPERPNT 27 (SUTURE) ×1 IMPLANT
SWAB CULTURE ESWAB REG 1ML (MISCELLANEOUS) IMPLANT
SYR CONTROL 10ML LL (SYRINGE) IMPLANT
TOWEL GREEN STERILE (TOWEL DISPOSABLE) ×3 IMPLANT
TOWEL GREEN STERILE FF (TOWEL DISPOSABLE) ×3 IMPLANT
TUBE CONNECTING 12'X1/4 (SUCTIONS) ×1
TUBE CONNECTING 12X1/4 (SUCTIONS) ×2 IMPLANT
UNDERPAD 30X30 (UNDERPADS AND DIAPERS) ×3 IMPLANT
WATER STERILE IRR 1000ML POUR (IV SOLUTION) ×3 IMPLANT
YANKAUER SUCT BULB TIP NO VENT (SUCTIONS) ×3 IMPLANT
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
## 2019-10-29 NOTE — Transfer of Care (Signed)
Immediate Anesthesia Transfer of Care Note  Patient: Latasha Powell  Procedure(s) Performed: TRICEPS TENDON REPAIR (Right Arm Upper) IRRIGATION AND DEBRIDEMENT EXTREMITY (Right Arm Upper)  Patient Location: PACU  Anesthesia Type:General  Level of Consciousness: awake and patient cooperative  Airway & Oxygen Therapy: Patient Spontanous Breathing  Post-op Assessment: Report given to RN and Post -op Vital signs reviewed and stable  Post vital signs: Reviewed and stable  Last Vitals:  Vitals Value Taken Time  BP 108/77 10/29/19 1452  Temp    Pulse 89 10/29/19 1454  Resp 14 10/29/19 1454  SpO2 98 % 10/29/19 1454  Vitals shown include unvalidated device data.  Last Pain:  Vitals:   10/29/19 1309  TempSrc:   PainSc: 8       Patients Stated Pain Goal: 3 (10/29/19 1309)  Complications: No apparent anesthesia complications
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
## 2019-10-29 NOTE — Interval H&P Note (Signed)
Patient elected to proceed. We plan for definitive fixation today.
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
## 2019-10-29 NOTE — Plan of Care (Signed)

## 2019-10-29 NOTE — Anesthesia Procedure Notes (Signed)
Procedure Name: Intubation Date/Time: 10/29/2019 1:34 PM Performed by: Adria Dill, CRNA Pre-anesthesia Checklist: Patient identified, Emergency Drugs available, Suction available and Patient being monitored Patient Re-evaluated:Patient Re-evaluated prior to induction Oxygen Delivery Method: Circle system utilized Preoxygenation: Pre-oxygenation with 100% oxygen Induction Type: IV induction Ventilation: Mask ventilation without difficulty Laryngoscope Size: Miller and 2 Grade View: Grade I Tube type: Oral Tube size: 7.0 mm Number of attempts: 1 Airway Equipment and Method: Stylet and Oral airway Placement Confirmation: ETT inserted through vocal cords under direct vision,  positive ETCO2 and breath sounds checked- equal and bilateral Secured at: 21 cm Tube secured with: Tape Dental Injury: Teeth and Oropharynx as per pre-operative assessment
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
## 2019-10-30 LAB — URINALYSIS, COMPLETE (UACMP) WITH MICROSCOPIC
Bacteria, UA: NONE SEEN
Bilirubin Urine: NEGATIVE
Glucose, UA: NEGATIVE mg/dL
Hgb urine dipstick: NEGATIVE
Ketones, ur: 5 mg/dL — AB
Leukocytes,Ua: NEGATIVE
Nitrite: NEGATIVE
Protein, ur: NEGATIVE mg/dL
Specific Gravity, Urine: 1.013 (ref 1.005–1.030)
pH: 7 (ref 5.0–8.0)

## 2019-10-30 MED ORDER — PROMETHAZINE HCL 25 MG/ML IJ SOLN: 12.5000 mg | Freq: Four times a day (QID) | INTRAMUSCULAR | Status: DC | PRN

## 2019-10-30 MED ORDER — POLYETHYLENE GLYCOL 3350 17 G PO PACK: 17.0000 g | PACK | Freq: Every day | ORAL | Status: DC

## 2019-10-30 MED ORDER — TRAMADOL HCL 50 MG PO TABS: 50.0000 mg | ORAL_TABLET | ORAL | Status: DC | PRN

## 2019-10-30 MED ORDER — TRAMADOL HCL 50 MG PO TABS
50.0000 mg | ORAL_TABLET | Freq: Four times a day (QID) | ORAL | Status: DC | PRN
  Administered 2019-10-30: 100 mg via ORAL
  Administered 2019-10-30 (×2): 50 mg via ORAL
  Filled 2019-10-30: qty 1
  Filled 2019-10-30: qty 2
  Filled 2019-10-30: qty 1

## 2019-10-30 NOTE — Plan of Care (Signed)

## 2019-10-30 NOTE — Plan of Care (Signed)

## 2019-10-30 NOTE — Progress Notes (Signed)
   ORTHOPAEDIC PROGRESS NOTE  s/p Procedure(s): TRICEPS TENDON REPAIR IRRIGATION AND DEBRIDEMENT EXTREMITY on 10/29/2019 by Dr. Everardo Pacific  SUBJECTIVE: Patient reports pain in her elbow and right knee. She was nauseous after surgery yesterday. She does not want to eat because of the nausea. She is apprehensive about going home due to the pain and nausea. She also had some bloody vaginal discharge this AM, but does not normally have a period due to her current birth control.  OBJECTIVE: PE: Right upper extremity: Splint CDI. Skin intact though cannot assess fully beneath splint. Nontender to palpation proximally, with full and painless ROM throughout hand with DPC of 0. + Motor in  AIN, PIN, Ulnar distributions. Sensation intact in medial, radial, and ulnar distributions. Well perfused digits. Prevena with good seal - no output.    Vitals:   10/29/19 2144 10/30/19 0509  BP: 118/77 104/74  Pulse: 69 78  Resp: 18 18  Temp: 98.4 F (36.9 C) 98 F (36.7 C)  SpO2: 96% 98%     ASSESSMENT: Latasha Powell is a 29 y.o. female doing reasonable postoperatively. POD#1.  PLAN: Weightbearing: NWB RUE Insicional and dressing care: Reinforce dressings as needed Orthopedic device(s): Wound QPY:[id number] and Splint Sling for comfort. Showering: Post-op day #3. Keep splint clean and dry. VTE prophylaxis: per trauma Pain control: PRN pain medication. Preferring oral medications Follow - up plan: On Friday, 4/16, in office for wound check. Pravena to stay on until follow-up appointment on Friday. Contact information: Dr. Ramond Marrow, Alfonse Alpers PA-C  Patient is stable from orthopedic standpoint once off IV pain medication and cleared for discharge from trauma team. She will discharge home with close follow-up in office on Friday.   Alfonse Alpers, PA-C 10/30/2019

## 2019-10-30 NOTE — Discharge Instructions (Signed)
Ramond Marrow MD, MPH Alfonse Alpers, PA-C [facility] [address] [phone] (tel)   [phone] (fax)   POST-OPERATIVE INSTRUCTIONS   WOUND CARE ? Please keep splint clean dry and intact until followup.  ? You must keep splint dry during this process and may find that a plastic bag taped around the extremity or alternatively a towel based bath may be a better option.   ? If you get your splint wet or if it is damaged please contact our clinic. ? If your wound vac alarm goes off, please call the office. We will need to recheck it if there is concern for an air leak   EXERCISES ? Due to your splint being in place you will not be able to bear weight through your extremity.    ? You may use your sling for comfort.  ? Please continue to work on range of motion of your fingers and stretch these multiple times a day to prevent stiffness. ? Please continue to ambulate and do not stay sitting or lying for too long. Perform foot and wrist pumps to assist in circulation.  FOLLOW-UP ? If you develop a Fever (>101.5), Redness or Drainage from the surgical incision site, please call our office to arrange for an evaluation. ? Please ensure you have an appointment on Friday April 16th. We need to recheck your wound at that time.   REGIONAL ANESTHESIA (NERVE BLOCKS) . The anesthesia team may have performed a nerve block for you if safe in the setting of your care.  This is a great tool used to minimize pain.  Typically the block may start wearing off overnight but the long acting medicine may last for 3-4 days.  The nerve block wearing off can be a challenging period but please utilize your as needed pain medications to try and manage this period.    HELPFUL INFORMATION  . If you had a block, it will wear off between 8-24 hrs postop typically.  This is period when your pain may go from nearly zero to the pain you would have had postop without the block.  This  is an abrupt transition but nothing dangerous is happening.  You may take an extra dose of narcotic when this happens.  . You may be more comfortable sleeping in a semi-seated position the first few nights following surgery.  Keep a pillow propped under the elbow and forearm for comfort.  If you have a recliner type of chair it might be beneficial.  If not that is fine too, but it would be helpful to sleep propped up with pillows behind your operated shoulder as well under your elbow and forearm.  This will reduce pulling on the suture lines.  . When dressing, put your operative arm in the sleeve first.  When getting undressed, take your operative arm out last.  Loose fitting, button-down shirts are recommended.  Often in the first days after surgery you may be more comfortable keeping your operative arm under your shirt and not through the sleeve.  . You may return to work/school in the next couple of days when you feel up to it.  Desk work and typing in the sling is fine.  . We suggest you use the pain medication the first night prior to going to bed, in order to ease any pain when the anesthesia wears off. You should avoid taking pain medications on an empty stomach as it will make you nauseous.  Marland Kitchen  You should wean off your narcotic medicines as soon as you are able.  Most patients will be off or using minimal narcotics before their first postop appointment.   . Do not drink alcoholic beverages or take illicit drugs when taking pain medications.  . It is against the law to drive while taking narcotics.  In some states it is against the law to drive while your arm is in a sling.   . Pain medication may make you constipated.  Below are a few solutions to try in this order:   - Decrease the amount of pain medication if you aren't having pain.   - Drink lots of decaffeinated fluids.   - Drink prune juice and/or each dried prunes  . If the first 3 don't work start with additional  solutions   - Take Colace - an over-the-counter stool softener   - Take Senokot - an over-the-counter laxative   - Take Miralax - a stronger over-the-counter laxative    Concussion, Adult  A concussion is a brain injury from a hard, direct hit (trauma) to the head or body. This direct hit causes the brain to shake quickly back and forth inside the skull. This can damage brain cells and cause chemical changes in the brain. A concussion may also be known as a mild traumatic brain injury (TBI). Concussions are usually not life-threatening, but the effects of a concussion can be serious. If you have a concussion, you should be very careful to avoid having a second concussion. What are the causes? This condition is caused by:  A direct hit to your head, such as: ? Running into another player during a game. ? Being hit in a fight. ? Hitting your head on a hard surface.  Sudden movement of your body that causes your brain to move back and forth inside the skull, such as in a car crash. What are the signs or symptoms? The signs of a concussion can be hard to notice. Early on, they may be missed by you, family members, and health care providers. You may look fine on the outside but may act or feel differently. Symptoms are usually temporary and most often improve in 7-10 days. Some symptoms appear right away, but other symptoms may not show up for hours or days. If your symptoms last longer than normal, you may have post-concussion syndrome. Every head injury is different. Physical symptoms  Headaches. This can include a feeling of pressure in the head or migraine-like symptoms.  Tiredness (fatigue).  Dizziness.  Problems with coordination or balance.  Vision or hearing problems.  Sensitivity to light or noise.  Nausea or vomiting.  Changes in eating or sleeping patterns.  Numbness or tingling.  Seizure. Mental and emotional symptoms  Memory problems.  Trouble concentrating,  organizing, or making decisions.  Slowness in thinking, acting or reacting, speaking, or reading.  Irritability or mood changes.  Anxiety or depression. How is this diagnosed? This condition is diagnosed based on:  Your symptoms.  A description of your injury. You may also have tests, including:  Imaging tests, such as a CT scan or MRI.  Neuropsychological tests. These measure your thinking, understanding, learning, and remembering abilities. How is this treated? Treatment for this condition includes:  Stopping sports or activity if you are injured. If you hit your head or show signs of concussion: ? Do not return to sports or activities the same day. ? Get checked by a health care provider before you return to  your activities.  Physical and mental rest and careful observation, usually at home. Gradually return to your normal activities.  Medicines to help with symptoms such as headaches, nausea, or difficulty sleeping. ? Avoid taking opioid pain medicine while recovering from a concussion.  Avoiding alcohol and drugs. These may slow your recovery and can put you at risk of further injury.  Referral to a concussion clinic or rehabilitation center. Recovery from a concussion can take time. How fast you recover depends on many factors. Return to activities only when:  Your symptoms are completely gone.  Your health care provider says that it is safe. Follow these instructions at home: Activity  Limit activities that require a lot of thought or concentration, such as: ? Doing homework or job-related work. ? Watching TV. ? Working on the computer or phone. ? Playing memory games and puzzles.  Rest. Rest helps your brain heal. Make sure you: ? Get plenty of sleep. Most adults should get 7-9 hours of sleep each night. ? Rest during the day. Take naps or rest breaks when you feel tired.  Avoid physical activity like exercise until your health care provider says it is safe.  Stop any activity that worsens symptoms.  Do not do high-risk activities that could cause a second concussion, such as riding a bike or playing sports.  Ask your health care provider when you can return to your normal activities, such as school, work, athletics, and driving. Your ability to react may be slower after a brain injury. Never do these activities if you are dizzy. Your health care provider will likely give you a plan for gradually returning to activities. General instructions   Take over-the-counter and prescription medicines only as told by your health care provider. Some medicines, such as blood thinners (anticoagulants) and aspirin, may increase the risk for complications, such as bleeding.  Do not drink alcohol until your health care provider says you can.  Watch your symptoms and tell others around you to do the same. Complications sometimes occur after a concussion. Older adults with a brain injury may have a higher risk of serious complications.  Tell your work Production designer, theatre/television/film, teachers, Tax adviser, school counselor, coach, or Event organiser about your injury, symptoms, and restrictions.  Keep all follow-up visits as told by your health care provider. This is important. How is this prevented? Avoiding another brain injury is very important. In rare cases, another injury can lead to permanent brain damage, brain swelling, or death. The risk of this is greatest during the first 7-10 days after a head injury. Avoid injuries by:  Stopping activities that could lead to a second concussion, such as contact or recreational sports, until your health care provider says it is okay.  Taking these actions once you have returned to sports or activities: ? Avoiding plays or moves that can cause you to crash into another person. This is how most concussions occur. ? Following the rules and being respectful of other players. Do not engage in violent or illegal plays.  Getting regular exercise  that includes strength and balance training.  Wearing a properly fitting helmet during sports, biking, or other activities. Helmets can help protect you from serious skull and brain injuries, but they do not protect you from a concussion. Even when wearing a helmet, you should avoid being hit in the head. Contact a health care provider if:  Your symptoms get worse or they do not improve.  You have new symptoms.  You have another  injury. Get help right away if:  You have severe or worsening headaches.  You have weakness or numbness in any part of your body.  You are confused.  Your coordination gets worse.  You vomit repeatedly.  You are sleepier than normal.  Your speech is slurred.  You cannot recognize people or places.  You have a seizure.  It is difficult to wake you up.  You have unusual behavior changes.  You have changes in your vision.  You lose consciousness. Summary  A concussion is a brain injury that results from a hard, direct hit (trauma) to your head or body.  You may have imaging tests and neuropsychological tests to diagnose a concussion.  Treatment for this condition includes physical and mental rest and careful observation.  Ask your health care provider when you can return to your normal activities, such as school, work, athletics, and driving.  Get help right away if you have a severe headache, weakness on one side of the body, seizures, behavior changes, changes in vision, or if you are confused or sleepier than normal. This information is not intended to replace advice given to you by your health care provider. Make sure you discuss any questions you have with your health care provider. Document Revised: 02/23/2018 Document Reviewed: 02/23/2018 Elsevier Patient Education  2020 [employer].
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
## 2019-10-30 NOTE — Progress Notes (Signed)
Pt has small amount of bloody discharge when using the bathroom. Pt states she does not normally have a period d/t the birthcontrol she is on, but she stays it may be her menstrual cycle. Will continue to monitor.

## 2019-10-30 NOTE — Anesthesia Postprocedure Evaluation (Signed)
Anesthesia Post Note  Patient: Myrla Halsted  Procedure(s) Performed: TRICEPS TENDON REPAIR (Right Arm Upper) IRRIGATION AND DEBRIDEMENT EXTREMITY (Right Arm Upper)     Patient location during evaluation: PACU Anesthesia Type: General Level of consciousness: awake and alert Pain management: pain level controlled Vital Signs Assessment: post-procedure vital signs reviewed and stable Respiratory status: spontaneous breathing, nonlabored ventilation, respiratory function stable and patient connected to nasal cannula oxygen Cardiovascular status: blood pressure returned to baseline and stable Postop Assessment: no apparent nausea or vomiting Anesthetic complications: no    Last Vitals:  Vitals:   10/30/19 1530 10/30/19 1910  BP: 106/68 110/66  Pulse: 79 84  Resp: 16 16  Temp: 36.8 C 36.8 C  SpO2: 99% 100%    Last Pain:  Vitals:   10/30/19 1942  TempSrc:   PainSc: 3                  Lucresha Dismuke
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
## 2019-10-31 MED ORDER — TRAMADOL HCL 50 MG PO TABS: 50.0000 mg | ORAL_TABLET | Freq: Four times a day (QID) | ORAL | 0 refills | Status: DC | PRN

## 2019-10-31 MED ORDER — ACETAMINOPHEN 325 MG PO TABS: 650.0000 mg | ORAL_TABLET | Freq: Four times a day (QID) | ORAL | Status: DC | PRN

## 2019-10-31 MED ORDER — CYCLOBENZAPRINE HCL 5 MG PO TABS: 5.0000 mg | ORAL_TABLET | Freq: Three times a day (TID) | ORAL | 0 refills | Status: DC | PRN

## 2019-10-31 MED ORDER — BACITRACIN-NEOMYCIN-POLYMYXIN OINTMENT TUBE: 1.0000 "application " | TOPICAL_OINTMENT | Freq: Every day | CUTANEOUS | Status: DC

## 2019-10-31 MED ORDER — ONDANSETRON 4 MG PO TBDP: 4.0000 mg | ORAL_TABLET | Freq: Three times a day (TID) | ORAL | 0 refills | Status: DC | PRN

## 2019-10-31 MED ORDER — POLYETHYLENE GLYCOL 3350 17 G PO PACK: 17.0000 g | PACK | Freq: Every day | ORAL | 0 refills | Status: DC | PRN
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
## 2019-10-31 NOTE — Progress Notes (Signed)
   ORTHOPAEDIC PROGRESS NOTE  s/p Procedure(s): TRICEPS TENDON REPAIR IRRIGATION AND DEBRIDEMENT EXTREMITY on 10/29/2019 by Dr. Everardo Pacific  SUBJECTIVE: Patient's nausea has improved today. Her pain is getting better.   OBJECTIVE: PE: Right upper extremity: Splint CDI. Skin intact though cannot assess fully beneath splint. Nontender to palpation proximally, with full and painless ROM throughout hand with DPC of 0. + Motor in  AIN, PIN, Ulnar distributions. Sensation intact in medial, radial, and ulnar distributions. Well perfused digits. Prevena in place.   Vitals:   10/30/19 1910 10/31/19 0358  BP: 110/66 101/65  Pulse: 84 70  Resp: 16 18  Temp: 98.3 F (36.8 C) 98.2 F (36.8 C)  SpO2: 100% 98%     ASSESSMENT: Latasha Powell is a 29 y.o. female doing reasonable postoperatively. POD#2.  PLAN: Weightbearing: NWB RUE Insicional and dressing care: Reinforce dressings as needed Keep splint clean and dry.  Orthopedic device(s): Wound MBT:DHRCBUL and Splint Sling for comfort. Showering: Post-op day #3. Keep splint clean and dry. VTE prophylaxis: per trauma Pain control: PRN pain medication. Preferring oral medications Follow - up plan: On Friday, 4/16, in office for wound check. Pravena to stay on until follow-up appointment on Friday. Contact information: Dr. Ramond Marrow, Alfonse Alpers PA-C  Patient is stable from orthopedic standpoint once cleared by trauma team. She will discharge home with close follow-up in office on Friday.   Alfonse Alpers, PA-C 10/31/2019

## 2019-10-31 NOTE — Progress Notes (Signed)
Spanish Interpretor Used, All questions answered.

## 2019-11-01 ENCOUNTER — Encounter (HOSPITAL_COMMUNITY): Payer: Self-pay | Admitting: Emergency Medicine

## 2020-03-12 ENCOUNTER — Other Ambulatory Visit: Payer: Self-pay

## 2020-03-12 ENCOUNTER — Encounter (HOSPITAL_BASED_OUTPATIENT_CLINIC_OR_DEPARTMENT_OTHER): Payer: Self-pay | Admitting: Orthopaedic Surgery

## 2020-03-17 ENCOUNTER — Other Ambulatory Visit (HOSPITAL_COMMUNITY)
Admission: RE | Admit: 2020-03-17 | Discharge: 2020-03-17 | Disposition: A | Discharge status: Home / Self Care | Payer: 59 | Source: Ambulatory Visit | Attending: Orthopaedic Surgery | Admitting: Orthopaedic Surgery

## 2020-03-17 DIAGNOSIS — Z20822 Contact with and (suspected) exposure to covid-19: Secondary | ICD-10-CM | POA: Diagnosis not present

## 2020-03-17 DIAGNOSIS — Z01812 Encounter for preprocedural laboratory examination: Secondary | ICD-10-CM | POA: Insufficient documentation

## 2020-03-17 LAB — SARS CORONAVIRUS 2 (TAT 6-24 HRS): SARS Coronavirus 2: NEGATIVE

## 2020-03-19 NOTE — H&P (Signed)
PREOPERATIVE H&P  Chief Complaint: RIGHT ELBOW CONTRACTURE, LESION OF ULNAR NERVE  HPI: Latasha Powell is a 29 y.o. female who is scheduled for RIGHT ELBOW ARTHROTOMY W/CAPSULAR RELEASE NEUROPLASTY AND TRANSPOSITION ULNAR NERVE AT ELBOW.   Patient is a healthy 29 year old. She underwent a right open triceps fracture with repair of triceps tendon on 10/29/2019 by Dr. Everardo Pacific. She continued to be very stiff after surgery and was not making progress in her motion with therapy.  She is unhappy with her progress as she is not able to use her right arm to eat.  She has been in her JAS splint.   Her symptoms are rated as moderate to severe, and have been worsening.  This is significantly impairing activities of daily living.    Please see clinic note for further details on this patient's care.    She has elected for surgical management.   Past Medical History:  Diagnosis Date  . Medical history non-contributory    Past Surgical History:  Procedure Laterality Date  . APPENDECTOMY    . CESAREAN SECTION    . CESAREAN SECTION     X2  . I & D EXTREMITY Right 10/25/2019   Procedure: Right elbow joint incision debridement with excisional debridement of bone and tendon as well as subcutaneous tissue.  Right deep wound incision debridement with excisional debridement of skin ,muscle, and fascia. ;  Surgeon: Bjorn Pippin, MD;  Location: [facility] OR;  Service: Orthopedics;  Laterality: Right;  . I & D EXTREMITY Right 10/29/2019   Procedure: IRRIGATION AND DEBRIDEMENT EXTREMITY;  Surgeon: Bjorn Pippin, MD;  Location: [facility] OR;  Service: Orthopedics;  Laterality: Right;  . TRICEPS TENDON REPAIR Right 10/29/2019   Procedure: TRICEPS TENDON REPAIR;  Surgeon: Bjorn Pippin, MD;  Location: [facility] OR;  Service: Orthopedics;  Laterality: Right;   Social History   Socioeconomic History  . Marital status: Married    Spouse name: Not on file  . Number of children: Not on file  . Years of education: Not  on file  . Highest education level: Not on file  Occupational History  . Not on file  Tobacco Use  . Smoking status: Former Games developer  . Smokeless tobacco: Never Used  Vaping Use  . Vaping Use: Former  . Substances: Nicotine  . Devices: QUIT 1 YEAR AGO  Substance and Sexual Activity  . Alcohol use: Yes    Comment: SOCIAL  . Drug use: Never  . Sexual activity: Not on file  Other Topics Concern  . Not on file  Social History Narrative   ** Merged History Encounter **       Social Determinants of Health   Financial Resource Strain:   . Difficulty of Paying Living Expenses: Not on file  Food Insecurity:   . Worried About Programme researcher, broadcasting/film/video in the Last Year: Not on file  . Ran Out of Food in the Last Year: Not on file  Transportation Needs:   . Lack of Transportation (Medical): Not on file  . Lack of Transportation (Non-Medical): Not on file  Physical Activity:   . Days of Exercise per Week: Not on file  . Minutes of Exercise per Session: Not on file  Stress:   . Feeling of Stress : Not on file  Social Connections:   . Frequency of Communication with Friends and Family: Not on file  . Frequency of Social Gatherings with Friends and Family: Not on file  .  Attends Religious Services: Not on file  . Active Member of Clubs or Organizations: Not on file  . Attends Banker Meetings: Not on file  . Marital Status: Not on file   History reviewed. No pertinent family history. No Known Allergies Prior to Admission medications   Medication Sig Start Date End Date Taking? Authorizing Provider  acetaminophen (TYLENOL) 325 MG tablet Take 2 tablets (650 mg total) by mouth every 6 (six) hours as needed. 10/31/19   Maczis, Elmer Sow, PA-C  cyclobenzaprine (FLEXERIL) 5 MG tablet Take 1 tablet (5 mg total) by mouth 3 (three) times daily as needed for muscle spasms. 10/31/19   Maczis, Elmer Sow, PA-C  ibuprofen (ADVIL) 200 MG tablet Take 200 mg by mouth every 6 (six) hours as  needed for moderate pain.    [provider]  neomycin-bacitracin-polymyxin (NEOSPORIN) OINT Apply 1 application topically daily. 10/31/19   Maczis, Elmer Sow, PA-C  ondansetron (ZOFRAN ODT) 4 MG disintegrating tablet Take 1 tablet (4 mg total) by mouth every 8 (eight) hours as needed for nausea or vomiting. Patient not taking: Reported on 03/06/2019 08/26/18   Ward, Chase Picket, PA-C  ondansetron (ZOFRAN-ODT) 4 MG disintegrating tablet Take 1 tablet (4 mg total) by mouth every 8 (eight) hours as needed for nausea or vomiting. 10/31/19   Maczis, Elmer Sow, PA-C  phenazopyridine (PYRIDIUM) 97 MG tablet Take 1 tablet (97 mg total) by mouth 3 (three) times daily as needed for pain. 03/06/19   Fawze, Mina A, PA-C  polyethylene glycol (MIRALAX / GLYCOLAX) 17 g packet Take 17 g by mouth daily as needed. 10/31/19   Maczis, Elmer Sow, PA-C  traMADol (ULTRAM) 50 MG tablet Take 1 tablet (50 mg total) by mouth every 6 (six) hours as needed for severe pain. 10/31/19   Maczis, Elmer Sow, PA-C    ROS: All other systems have been reviewed and were otherwise negative with the exception of those mentioned in the HPI and as above.  Physical Exam: General: Alert, no acute distress Cardiovascular: No pedal edema Respiratory: No cyanosis, no use of accessory musculature GI: No organomegaly, abdomen is soft and non-tender Skin: No lesions in the area of chief complaint Neurologic: Sensation intact distally Psychiatric: Patient is competent for consent with normal mood and affect Lymphatic: No axillary or cervical lymphadenopathy  MUSCULOSKELETAL:  Right elbow: Examination of the right elbow incision is benign.  Range of motion:  she has 85 degrees of flexion.  She lacks about 55 degrees of full extension.  She is extraordinarily stiff with a hard end point.  Positive Tinel's at the cubital tunnel.    Imaging: Two views of the elbow demonstrate no obvious acute osseous abnormality.    Assessment: RIGHT  ELBOW CONTRACTURE, LESION OF ULNAR NERVE  Plan: Plan for Procedure(s): RIGHT ELBOW ARTHROTOMY W/CAPSULAR RELEASE NEUROPLASTY AND TRANSPOSITION ULNAR NERVE AT ELBOW  The risks benefits and alternatives were discussed with the patient including but not limited to the risks of nonoperative treatment, versus surgical intervention including infection, bleeding, nerve injury,  blood clots, cardiopulmonary complications, morbidity, mortality, among others, and they were willing to proceed.   The patient acknowledged the explanation, agreed to proceed with the plan and consent was signed.   Operative Plan: Right elbow capsular release, lysis of adhesions and ulnar nerve neurolysis Discharge Medications: Tylenol, Diclofenac, Gabapentin, Oxycodone, Zofran DVT Prophylaxis: None Physical Therapy: Outpatient PT Special Discharge needs: Sling for comfort    Vernetta Honey, PA-C  03/19/2020 1:36 PM

## 2020-03-20 ENCOUNTER — Encounter (HOSPITAL_BASED_OUTPATIENT_CLINIC_OR_DEPARTMENT_OTHER): Payer: Self-pay | Admitting: Orthopaedic Surgery

## 2020-03-20 ENCOUNTER — Encounter (HOSPITAL_BASED_OUTPATIENT_CLINIC_OR_DEPARTMENT_OTHER)
Admission: RE | Disposition: A | Discharge status: Home / Self Care | Payer: Self-pay | Source: Home / Self Care | Attending: Orthopaedic Surgery

## 2020-03-20 ENCOUNTER — Ambulatory Visit (HOSPITAL_BASED_OUTPATIENT_CLINIC_OR_DEPARTMENT_OTHER)
Admission: RE | Admit: 2020-03-20 | Discharge: 2020-03-20 | Disposition: A | Discharge status: Home / Self Care | Payer: 59 | Attending: Orthopaedic Surgery | Admitting: Orthopaedic Surgery

## 2020-03-20 ENCOUNTER — Other Ambulatory Visit: Payer: Self-pay

## 2020-03-20 ENCOUNTER — Ambulatory Visit (HOSPITAL_BASED_OUTPATIENT_CLINIC_OR_DEPARTMENT_OTHER): Payer: 59 | Admitting: Anesthesiology

## 2020-03-20 DIAGNOSIS — Z79899 Other long term (current) drug therapy: Secondary | ICD-10-CM | POA: Diagnosis not present

## 2020-03-20 DIAGNOSIS — M24521 Contracture, right elbow: Secondary | ICD-10-CM | POA: Diagnosis not present

## 2020-03-20 DIAGNOSIS — G5621 Lesion of ulnar nerve, right upper limb: Secondary | ICD-10-CM | POA: Insufficient documentation

## 2020-03-20 DIAGNOSIS — Z791 Long term (current) use of non-steroidal anti-inflammatories (NSAID): Secondary | ICD-10-CM | POA: Insufficient documentation

## 2020-03-20 DIAGNOSIS — Z87891 Personal history of nicotine dependence: Secondary | ICD-10-CM | POA: Diagnosis not present

## 2020-03-20 HISTORY — PX: ULNAR NERVE TRANSPOSITION: SHX2595

## 2020-03-20 HISTORY — PX: CAPSULAR RELEASE: SHX6293

## 2020-03-20 LAB — POCT PREGNANCY, URINE: Preg Test, Ur: NEGATIVE

## 2020-03-20 SURGERY — CAPSULAR RELEASE
Anesthesia: General | Site: Elbow | Laterality: Right

## 2020-03-20 MED ORDER — FENTANYL CITRATE (PF) 100 MCG/2ML IJ SOLN
INTRAMUSCULAR | Status: AC
  Filled 2020-03-20: qty 2

## 2020-03-20 MED ORDER — DEXAMETHASONE SODIUM PHOSPHATE 10 MG/ML IJ SOLN
INTRAMUSCULAR | Status: AC
  Filled 2020-03-20: qty 1

## 2020-03-20 MED ORDER — VANCOMYCIN HCL 1000 MG IV SOLR
INTRAVENOUS | Status: DC | PRN
  Administered 2020-03-20: 1000 mg

## 2020-03-20 MED ORDER — HYDROMORPHONE HCL 1 MG/ML IJ SOLN
INTRAMUSCULAR | Status: AC
Start: 2020-03-20 — End: ?
  Filled 2020-03-20: qty 0.5

## 2020-03-20 MED ORDER — OXYCODONE HCL 5 MG PO TABS
5.0000 mg | ORAL_TABLET | Freq: Once | ORAL | Status: AC
  Administered 2020-03-20: 5 mg via ORAL

## 2020-03-20 MED ORDER — ROCURONIUM BROMIDE 100 MG/10ML IV SOLN
INTRAVENOUS | Status: DC | PRN
  Administered 2020-03-20: 50 mg via INTRAVENOUS

## 2020-03-20 MED ORDER — LIDOCAINE HCL (CARDIAC) PF 100 MG/5ML IV SOSY
PREFILLED_SYRINGE | INTRAVENOUS | Status: DC | PRN
  Administered 2020-03-20: 70 mg via INTRATRACHEAL

## 2020-03-20 MED ORDER — ROCURONIUM BROMIDE 10 MG/ML (PF) SYRINGE
PREFILLED_SYRINGE | INTRAVENOUS | Status: AC
  Filled 2020-03-20: qty 10

## 2020-03-20 MED ORDER — ONDANSETRON HCL 4 MG/2ML IJ SOLN
INTRAMUSCULAR | Status: DC | PRN
  Administered 2020-03-20: 4 mg via INTRAVENOUS

## 2020-03-20 MED ORDER — FENTANYL CITRATE (PF) 100 MCG/2ML IJ SOLN
25.0000 ug | INTRAMUSCULAR | Status: DC | PRN
  Administered 2020-03-20 (×4): 50 ug via INTRAVENOUS

## 2020-03-20 MED ORDER — TRAMADOL HCL 50 MG PO TABS
50.0000 mg | ORAL_TABLET | Freq: Once | ORAL | Status: AC
  Administered 2020-03-20: 50 mg via ORAL

## 2020-03-20 MED ORDER — LACTATED RINGERS IV SOLN: INTRAVENOUS | Status: DC

## 2020-03-20 MED ORDER — ONDANSETRON HCL 4 MG/2ML IJ SOLN
INTRAMUSCULAR | Status: AC
  Filled 2020-03-20: qty 2

## 2020-03-20 MED ORDER — OXYCODONE HCL 5 MG PO TABS
ORAL_TABLET | ORAL | Status: AC
  Filled 2020-03-20: qty 1

## 2020-03-20 MED ORDER — LIDOCAINE 2% (20 MG/ML) 5 ML SYRINGE
INTRAMUSCULAR | Status: AC
  Filled 2020-03-20: qty 5

## 2020-03-20 MED ORDER — FENTANYL CITRATE (PF) 100 MCG/2ML IJ SOLN
INTRAMUSCULAR | Status: DC | PRN
  Administered 2020-03-20: 100 ug via INTRAVENOUS
  Administered 2020-03-20 (×2): 50 ug via INTRAVENOUS

## 2020-03-20 MED ORDER — CEFAZOLIN SODIUM-DEXTROSE 2-4 GM/100ML-% IV SOLN
2.0000 g | INTRAVENOUS | Status: AC
  Administered 2020-03-20: 2 g via INTRAVENOUS

## 2020-03-20 MED ORDER — MIDAZOLAM HCL 5 MG/5ML IJ SOLN
INTRAMUSCULAR | Status: DC | PRN
  Administered 2020-03-20: 2 mg via INTRAVENOUS

## 2020-03-20 MED ORDER — ACETAMINOPHEN 500 MG PO TABS: 1000.0000 mg | ORAL_TABLET | Freq: Three times a day (TID) | ORAL | 0 refills | Status: AC

## 2020-03-20 MED ORDER — MIDAZOLAM HCL 2 MG/2ML IJ SOLN
INTRAMUSCULAR | Status: AC
  Filled 2020-03-20: qty 2

## 2020-03-20 MED ORDER — PROPOFOL 10 MG/ML IV BOLUS
INTRAVENOUS | Status: DC | PRN
  Administered 2020-03-20: 160 mg via INTRAVENOUS

## 2020-03-20 MED ORDER — SUGAMMADEX SODIUM 200 MG/2ML IV SOLN
INTRAVENOUS | Status: DC | PRN
  Administered 2020-03-20: 125 mg via INTRAVENOUS

## 2020-03-20 MED ORDER — DEXAMETHASONE SODIUM PHOSPHATE 10 MG/ML IJ SOLN
INTRAMUSCULAR | Status: DC | PRN
  Administered 2020-03-20: 10 mg via INTRAVENOUS

## 2020-03-20 MED ORDER — CEFAZOLIN SODIUM-DEXTROSE 2-4 GM/100ML-% IV SOLN
INTRAVENOUS | Status: AC
  Filled 2020-03-20: qty 100

## 2020-03-20 MED ORDER — GABAPENTIN 100 MG PO CAPS: 100.0000 mg | ORAL_CAPSULE | Freq: Three times a day (TID) | ORAL | 0 refills | Status: DC

## 2020-03-20 MED ORDER — DICLOFENAC SODIUM 75 MG PO TBEC: 75.0000 mg | DELAYED_RELEASE_TABLET | Freq: Two times a day (BID) | ORAL | 0 refills | Status: AC

## 2020-03-20 MED ORDER — BUPIVACAINE HCL 0.25 % IJ SOLN
INTRAMUSCULAR | Status: DC | PRN
  Administered 2020-03-20: 20 mL via INTRA_ARTICULAR

## 2020-03-20 MED ORDER — PROPOFOL 10 MG/ML IV BOLUS
INTRAVENOUS | Status: AC
  Filled 2020-03-20: qty 20

## 2020-03-20 MED ORDER — TRAMADOL HCL 50 MG PO TABS
ORAL_TABLET | ORAL | Status: AC
  Filled 2020-03-20: qty 1

## 2020-03-20 MED ORDER — ONDANSETRON HCL 4 MG PO TABS: 4.0000 mg | ORAL_TABLET | Freq: Three times a day (TID) | ORAL | 1 refills | Status: AC | PRN

## 2020-03-20 MED ORDER — OXYCODONE HCL 5 MG PO TABS: ORAL_TABLET | ORAL | 0 refills | Status: AC

## 2020-03-20 MED ORDER — HYDROMORPHONE HCL 1 MG/ML IJ SOLN
0.5000 mg | Freq: Once | INTRAMUSCULAR | Status: AC
  Administered 2020-03-20: 0.5 mg via INTRAVENOUS

## 2020-03-20 SURGICAL SUPPLY — 69 items
BLADE SURG 10 STRL SS (BLADE) ×3 IMPLANT
BLADE SURG 15 STRL LF DISP TIS (BLADE) ×1 IMPLANT
BLADE SURG 15 STRL SS (BLADE) ×2
BNDG COHESIVE 4X5 TAN STRL (GAUZE/BANDAGES/DRESSINGS) ×3 IMPLANT
BNDG ELASTIC 4X5.8 VLCR STR LF (GAUZE/BANDAGES/DRESSINGS) ×6 IMPLANT
BNDG ESMARK 4X9 LF (GAUZE/BANDAGES/DRESSINGS) ×3 IMPLANT
CHLORAPREP W/TINT 26 (MISCELLANEOUS) IMPLANT
CLOSURE STERI-STRIP 1/2X4 (GAUZE/BANDAGES/DRESSINGS) ×1
CLSR STERI-STRIP ANTIMIC 1/2X4 (GAUZE/BANDAGES/DRESSINGS) ×2 IMPLANT
COVER BACK TABLE 60X90IN (DRAPES) ×3 IMPLANT
COVER MAYO STAND STRL (DRAPES) ×3 IMPLANT
COVER WAND RF STERILE (DRAPES) IMPLANT
CUFF TOURN SGL QUICK 18X4 (TOURNIQUET CUFF) ×3 IMPLANT
CUFF TOURN SGL QUICK 24 (TOURNIQUET CUFF)
CUFF TRNQT CYL 24X4X16.5-23 (TOURNIQUET CUFF) IMPLANT
DECANTER SPIKE VIAL GLASS SM (MISCELLANEOUS) IMPLANT
DRAPE EXTREMITY T 121X128X90 (DISPOSABLE) ×3 IMPLANT
DRAPE IMP U-DRAPE 54X76 (DRAPES) ×3 IMPLANT
DRAPE OEC MINIVIEW 54X84 (DRAPES) IMPLANT
DRAPE U-SHAPE 47X51 STRL (DRAPES) ×3 IMPLANT
DRSG AQUACEL AG ADV 3.5X 6 (GAUZE/BANDAGES/DRESSINGS) IMPLANT
DRSG PAD ABDOMINAL 8X10 ST (GAUZE/BANDAGES/DRESSINGS) ×3 IMPLANT
DURAPREP 26ML APPLICATOR (WOUND CARE) ×3 IMPLANT
ELECT REM PT RETURN 9FT ADLT (ELECTROSURGICAL) ×3
ELECTRODE REM PT RTRN 9FT ADLT (ELECTROSURGICAL) ×1 IMPLANT
EXT HOSE W/PLC CONNECTION (MISCELLANEOUS) ×3
EXTENSION HOSE W/PLC CONNECTON (MISCELLANEOUS) ×1 IMPLANT
GAUZE SPONGE 4X4 12PLY STRL (GAUZE/BANDAGES/DRESSINGS) ×3 IMPLANT
GAUZE XEROFORM 1X8 LF (GAUZE/BANDAGES/DRESSINGS) ×3 IMPLANT
GLOVE BIO SURGEON STRL SZ 6.5 (GLOVE) ×2 IMPLANT
GLOVE BIO SURGEONS STRL SZ 6.5 (GLOVE) ×1
GLOVE BIOGEL PI IND STRL 6.5 (GLOVE) ×1 IMPLANT
GLOVE BIOGEL PI IND STRL 7.0 (GLOVE) ×2 IMPLANT
GLOVE BIOGEL PI IND STRL 8 (GLOVE) ×1 IMPLANT
GLOVE BIOGEL PI INDICATOR 6.5 (GLOVE) ×2
GLOVE BIOGEL PI INDICATOR 7.0 (GLOVE) ×4
GLOVE BIOGEL PI INDICATOR 8 (GLOVE) ×2
GLOVE ECLIPSE 6.5 STRL STRAW (GLOVE) ×3 IMPLANT
GLOVE ECLIPSE 8.0 STRL XLNG CF (GLOVE) ×3 IMPLANT
GOWN STRL REUS W/ TWL LRG LVL3 (GOWN DISPOSABLE) ×2 IMPLANT
GOWN STRL REUS W/TWL LRG LVL3 (GOWN DISPOSABLE) ×4
GOWN STRL REUS W/TWL XL LVL3 (GOWN DISPOSABLE) ×3 IMPLANT
LOOP VESSEL MAXI BLUE (MISCELLANEOUS) ×3 IMPLANT
NEEDLE HYPO 25X1 1.5 SAFETY (NEEDLE) ×3 IMPLANT
NS IRRIG 1000ML POUR BTL (IV SOLUTION) ×3 IMPLANT
PACK BASIN DAY SURGERY FS (CUSTOM PROCEDURE TRAY) ×3 IMPLANT
PAD CAST 4YDX4 CTTN HI CHSV (CAST SUPPLIES) ×1 IMPLANT
PADDING CAST COTTON 4X4 STRL (CAST SUPPLIES) ×2
PENCIL SMOKE EVACUATOR (MISCELLANEOUS) ×3 IMPLANT
SLEEVE SCD COMPRESS KNEE MED (MISCELLANEOUS) ×3 IMPLANT
SLING ARM FOAM STRAP LRG (SOFTGOODS) ×3 IMPLANT
SPLINT FAST PLASTER 5X30 (CAST SUPPLIES)
SPLINT PLASTER CAST FAST 5X30 (CAST SUPPLIES) IMPLANT
STOCKINETTE IMPERVIOUS LG (DRAPES) ×3 IMPLANT
SUCTION FRAZIER HANDLE 10FR (MISCELLANEOUS)
SUCTION TUBE FRAZIER 10FR DISP (MISCELLANEOUS) IMPLANT
SUT ETHILON 2 0 FS 18 (SUTURE) ×9 IMPLANT
SUT MNCRL AB 4-0 PS2 18 (SUTURE) IMPLANT
SUT VIC AB 0 CT1 27 (SUTURE) ×4
SUT VIC AB 0 CT1 27XBRD ANBCTR (SUTURE) ×2 IMPLANT
SUT VIC AB 0 SH 27 (SUTURE) IMPLANT
SUT VIC AB 3-0 SH 27 (SUTURE) ×2
SUT VIC AB 3-0 SH 27X BRD (SUTURE) ×1 IMPLANT
SYR BULB EAR ULCER 3OZ GRN STR (SYRINGE) ×3 IMPLANT
SYR CONTROL 10ML LL (SYRINGE) ×3 IMPLANT
TOWEL GREEN STERILE FF (TOWEL DISPOSABLE) ×6 IMPLANT
TUBE CONNECTING 20'X1/4 (TUBING) ×1
TUBE CONNECTING 20X1/4 (TUBING) ×2 IMPLANT
TUBE SUCTION HIGH CAP CLEAR NV (SUCTIONS) ×3 IMPLANT

## 2020-03-20 NOTE — Interval H&P Note (Signed)
History and Physical Interval Note:  03/20/2020 7:13 AM  Latasha Powell  has presented today for surgery, with the diagnosis of RIGHT ELBOW CONTRACTURE, LESION OF ULNAR NERVE.  The various methods of treatment have been discussed with the patient and family. After consideration of risks, benefits and other options for treatment, the patient has consented to  Procedure(s): RIGHT ELBOW ARTHROTOMY W/CAPSULAR RELEASE (Right) NEUROPLASTY AND TRANSPOSITION ULNAR NERVE AT ELBOW (Right) as a surgical intervention.  The patient's history has been reviewed, patient examined, no change in status, stable for surgery.  I have reviewed the patient's chart and labs.  Questions were answered to the patient's satisfaction.     Bjorn Pippin

## 2020-03-20 NOTE — Interval H&P Note (Signed)
History and Physical Interval Note:  03/20/2020 7:12 AM  Latasha Powell  has presented today for surgery, with the diagnosis of RIGHT ELBOW CONTRACTURE, LESION OF ULNAR NERVE.  The various methods of treatment have been discussed with the patient and family. After consideration of risks, benefits and other options for treatment, the patient has consented to  Procedure(s): RIGHT ELBOW ARTHROTOMY W/CAPSULAR RELEASE (Right) NEUROPLASTY AND TRANSPOSITION ULNAR NERVE AT ELBOW (Right) as a surgical intervention.  The patient's history has been reviewed, patient examined, no change in status, stable for surgery.  I have reviewed the patient's chart and labs.  Questions were answered to the patient's satisfaction.     Bjorn Pippin

## 2020-03-20 NOTE — Anesthesia Procedure Notes (Signed)
Procedure Name: Intubation Date/Time: 03/20/2020 7:33 AM Performed by: Thornell Mule, CRNA Pre-anesthesia Checklist: Patient identified, Emergency Drugs available, Suction available and Patient being monitored Patient Re-evaluated:Patient Re-evaluated prior to induction Oxygen Delivery Method: Circle system utilized Preoxygenation: Pre-oxygenation with 100% oxygen Induction Type: IV induction Ventilation: Mask ventilation without difficulty Laryngoscope Size: Miller and 3 Grade View: Grade I Tube type: Oral Tube size: 7.0 mm Number of attempts: 1 Airway Equipment and Method: Stylet and Oral airway Placement Confirmation: ETT inserted through vocal cords under direct vision,  positive ETCO2 and breath sounds checked- equal and bilateral Secured at: 21 cm Tube secured with: Tape Dental Injury: Teeth and Oropharynx as per pre-operative assessment

## 2020-03-20 NOTE — Transfer of Care (Signed)
Immediate Anesthesia Transfer of Care Note  Patient: Annalyce A Urdaneta-Castillo  Procedure(s) Performed: RIGHT ELBOW ARTHROTOMY W/CAPSULAR RELEASE (Right Elbow) NEUROPLASTY AND TRANSPOSITION ULNAR NERVE AT ELBOW (Right Elbow)  Patient Location: PACU  Anesthesia Type:General  Level of Consciousness: drowsy, patient cooperative and responds to stimulation  Airway & Oxygen Therapy: Patient Spontanous Breathing and Patient connected to face mask oxygen  Post-op Assessment: Report given to RN and Post -op Vital signs reviewed and stable  Post vital signs: Reviewed and stable  Last Vitals:  Vitals Value Taken Time  BP    Temp    Pulse 89 03/20/20 0845  Resp 22 03/20/20 0845  SpO2 100 % 03/20/20 0845  Vitals shown include unvalidated device data.  Last Pain:  Vitals:   03/20/20 0639  TempSrc: Oral  PainSc: 0-No pain      Patients Stated Pain Goal: 1 (03/20/20 8270)  Complications: No complications documented.

## 2020-03-20 NOTE — Anesthesia Postprocedure Evaluation (Signed)
Anesthesia Post Note  Patient: Latasha Powell  Procedure(s) Performed: RIGHT ELBOW ARTHROTOMY W/CAPSULAR RELEASE (Right Elbow) NEUROPLASTY AND TRANSPOSITION ULNAR NERVE AT ELBOW (Right Elbow)     Patient location during evaluation: PACU Anesthesia Type: General Level of consciousness: awake Pain management: pain level controlled Vital Signs Assessment: post-procedure vital signs reviewed and stable Respiratory status: spontaneous breathing Cardiovascular status: stable Postop Assessment: no apparent nausea or vomiting Anesthetic complications: no   No complications documented.  Last Vitals:  Vitals:   03/20/20 0900 03/20/20 0915  BP: 126/83 131/85  Pulse: 88 84  Resp: 14 18  Temp:    SpO2: 100% 100%    Last Pain:  Vitals:   03/20/20 0915  TempSrc:   PainSc: 10-Worst pain ever                 Zayyan Mullen

## 2020-03-20 NOTE — Op Note (Signed)
Orthopaedic Surgery Operative Note (CSN: [id number])  Latasha Powell  09/26/1990 Date of Surgery: 03/20/2020   Diagnoses:  RIGHT ELBOW CONTRACTURE, LESION OF ULNAR NERVE  Procedure: Ulnar nerve transposition subcutaneous Capsular release and manipulation under anesthesia   Operative Finding Successful completion of the planned procedure.  Preoperatively the patient's motion was 50-90 and she is very limited not able to reach her face or head.  Postoperatively we were able to get full extension to 0 and flexion to 140 degrees with full pronation pronation.  Very happy with the release.  We did a careful release of the triceps to avoid rupture of the triceps but the tendon repair was intact.  We were able to manipulate into extension without any issue and did not actually have to use the anterior approach and perform a capsulectomy.  If the patient stiffens up at 6 weeks would likely come back for just a gentle manipulation under anesthesia without opening up.  Post-operative plan: The patient will be weightbearing to tolerance.  The patient will be discharged home.  DVT prophylaxis not indicated in this ambulatory upper extremity patient without significant risk factors.   Pain control with PRN pain medication preferring oral medicines.  Follow up plan will be scheduled in approximately 7 days for incision check.  Post-Op Diagnosis: Same Surgeons:Primary: Bjorn Pippin, MD Assistants:Caroline McBane PA-C Location: [facility] OR ROOM 6 Anesthesia: General with local anesthesia Antibiotics: Ancef 2 g with local vancomycin powder 1 g at the surgical site Tourniquet time:  Total Tourniquet Time Documented: Upper Arm (Right) - 43 minutes Total: Upper Arm (Right) - 43 minutes  Estimated Blood Loss: Minimal Complications: None Specimens: None Implants: * No implants in log *  Indications for Surgery:   Latasha Powell is a 29 y.o. female with previous open olecranon fracture  and distal triceps injury status post repeat washout and repair.  Patient became very stiff and painful after surgery and had nonfunctional range of motion.  Benefits and risks of operative and nonoperative management were discussed prior to surgery with patient/guardian(s) and informed consent form was completed.  Specific risks including infection, need for additional surgery, stiffness, rupture of the triceps, continued pain and need for further manipulations amongst others   Procedure:   The patient was identified properly. Informed consent was obtained and the surgical site was marked. The patient was taken up to suite where general anesthesia was induced.  The patient was positioned lateral on a beanbag with arm over a sure foot positioner.  The right elbow was prepped and draped in the usual sterile fashion.  Timeout was performed before the beginning of the case.  Tourniquet was used for the above duration.  We began by using the patient's old traumatic incision and extending it posterior slightly proximally but not using all of it distally.  Went to skin sharply achieving full-thickness skin flaps and obtaining hemostasis as we progressed.  We were able to lift the medial lateral flaps without issue.  Medially we turned our attention to the ulnar nerve decompression.  We identified the ulnar nerve proximal to the elbow in a zone of normal tissue and noted that was significantly scarred down and adherent to the subcutaneous tissue as well as the deep tissue.  We were able to carefully neurolysis all adhesions and mobilize the nerve using a vessel loop.  At that point were able to release the branch to the articular surface and preserve the branch to the flexor carpi ulnaris splitting the  fascia of the FCU distally.  We able to release the nerve about 8 cm proximally and distally to the elbow and take down the antebrachial septum.  That point the nerve was easily able to be transposed in the  subcutaneous tissue anteriorly.  While on the medial side of the elbow while protecting the nerve we were able to release the posterior band of the ulnar collateral ligament and then carefully elevate the triceps off of the medial side of the humerus.  We are able to release the capsule in the posterior aspect of the elbow underneath the triceps.  The triceps was well adherent and appeared to be doing well as it healed.  We then turned our attention to the lateral side.  We elevated the posterior aspect of the lateral column elevating the triceps off of the humerus.  We again did a capsulectomy posteriorly.  We were at this point able to carefully work on gentle extension and got the elbow with gentle manipulation only to full extension.  This allowed [place] to avoid using the anterior portion of the release.  We then performed gentle manipulation under anesthesia and were able to get full flexion.  Were very happy with our release in the range of motion.  We closed the dead space with absorbable suture after irrigating copiously.  The nerve was transposed anterior to the medial epicondyle and a single Vicryl stitch was placed to keep the dead space close to avoid the nerve subluxing back into its groove.  There is no tension on the nerve at the end of the case.  Local vancomycin powder was placed and the skin was closed in multilayer fashion with nonabsorbable suture.    Sterile dressing was placed.  Patient was awoken taken to PACU in stable condition.  Alfonse Alpers, PA-C, present and scrubbed throughout the case, critical for completion in a timely fashion, and for retraction, instrumentation, closure.

## 2020-03-20 NOTE — Discharge Instructions (Signed)

## 2020-03-20 NOTE — Anesthesia Preprocedure Evaluation (Addendum)
Anesthesia Evaluation  Patient identified by MRN, date of birth, ID band Patient awake  General Assessment Comment:History noted CG  Reviewed: Allergy & Precautions, NPO status , Patient's Chart, lab work & pertinent test results  Airway Mallampati: II  TM Distance: >3 FB     Dental   Pulmonary former smoker,    breath sounds clear to auscultation       Cardiovascular negative cardio ROS   Rhythm:Regular Rate:Normal     Neuro/Psych    GI/Hepatic negative GI ROS, Neg liver ROS,   Endo/Other  negative endocrine ROS  Renal/GU negative Renal ROS     Musculoskeletal   Abdominal   Peds  Hematology   Anesthesia Other Findings   Reproductive/Obstetrics                            Anesthesia Physical Anesthesia Plan  ASA: II  Anesthesia Plan: General   Post-op Pain Management:    Induction: Intravenous  PONV Risk Score and Plan: 3 and Ondansetron, Dexamethasone and Midazolam  Airway Management Planned: LMA  Additional Equipment:   Intra-op Plan:   Post-operative Plan: Extubation in OR  Informed Consent: I have reviewed the patients History and Physical, chart, labs and discussed the procedure including the risks, benefits and alternatives for the proposed anesthesia with the patient or authorized representative who has indicated his/her understanding and acceptance.     Dental advisory given and Interpreter used for interveiw  Plan Discussed with: CRNA and Anesthesiologist  Anesthesia Plan Comments:        Anesthesia Quick Evaluation

## 2020-03-21 ENCOUNTER — Encounter (HOSPITAL_BASED_OUTPATIENT_CLINIC_OR_DEPARTMENT_OTHER): Payer: Self-pay | Admitting: Orthopaedic Surgery

## 2020-07-22 ENCOUNTER — Encounter: Payer: Self-pay | Admitting: Obstetrics and Gynecology

## 2020-07-22 ENCOUNTER — Other Ambulatory Visit: Payer: Self-pay

## 2020-07-22 ENCOUNTER — Ambulatory Visit (INDEPENDENT_AMBULATORY_CARE_PROVIDER_SITE_OTHER): Payer: 59 | Admitting: Obstetrics and Gynecology

## 2020-07-22 VITALS — BP 110/80 | Ht 63.0 in | Wt 138.0 lb

## 2020-07-22 DIAGNOSIS — R102 Pelvic and perineal pain: Secondary | ICD-10-CM | POA: Diagnosis not present

## 2020-07-22 DIAGNOSIS — B373 Candidiasis of vulva and vagina: Secondary | ICD-10-CM

## 2020-07-22 DIAGNOSIS — N898 Other specified noninflammatory disorders of vagina: Secondary | ICD-10-CM | POA: Diagnosis not present

## 2020-07-22 DIAGNOSIS — Z01419 Encounter for gynecological examination (general) (routine) without abnormal findings: Secondary | ICD-10-CM

## 2020-07-22 DIAGNOSIS — Z113 Encounter for screening for infections with a predominantly sexual mode of transmission: Secondary | ICD-10-CM | POA: Diagnosis not present

## 2020-07-22 DIAGNOSIS — N941 Unspecified dyspareunia: Secondary | ICD-10-CM

## 2020-07-22 DIAGNOSIS — B3731 Acute candidiasis of vulva and vagina: Secondary | ICD-10-CM

## 2020-07-22 MED ORDER — NORELGESTROMIN-ETH ESTRADIOL 150-35 MCG/24HR TD PTWK: 1.0000 | MEDICATED_PATCH | TRANSDERMAL | 4 refills | Status: DC

## 2020-07-22 MED ORDER — FLUCONAZOLE 150 MG PO TABS: 150.0000 mg | ORAL_TABLET | ORAL | 0 refills | Status: AC

## 2020-07-22 NOTE — Progress Notes (Signed)
Latasha Powell December 19, 1990 [id number]  SUBJECTIVE:  30 y.o. [id number] female new patient here for annual routine gynecologic exam and also to address few gynecologic concerns. Currently using Nexplanon for contraception.  Feels that her headaches have increased since the device was placed.  She says Nexplanon was placed 1 year ago.  She has become amenorrheic.  She has been considering getting it removed.  In the past 2 months she has resumed sexual intercourse with a new partner.  Says it is a monogamous relationship.  In the past 2 months she has developed more pain with intercourse, she indicates her sexual practices not anything unusual and not overly aggressive.  She has pain during intercourse and immediately afterwards.  Has general feeling of bloating and inflammation in the pelvic area.  She has noted some vaginal discharge in the past several days.  No specific dysuria symptoms.   Current Outpatient Medications  Medication Sig Dispense Refill  . Ascorbic Acid (VITAMIN C PO) Take by mouth.     No current facility-administered medications for this visit.   Allergies: Patient has no known allergies.  No LMP recorded. Patient has had an implant.  Past medical history,surgical history, problem list, medications, allergies, family history and social history were all reviewed and documented as reviewed in the EPIC chart.  ROS: Pertinent positives and negatives as reviewed in HPI   OBJECTIVE:  BP 110/80 (BP Location: Right Arm, Patient Position: Sitting, Cuff Size: Normal)   Ht 5\' 3"  (1.6 m)   Wt 138 lb (62.6 kg)   BMI 24.45 kg/m  The patient appears well, alert, oriented, in no distress. ENT normal.  Neck supple. No cervical or supraclavicular adenopathy or thyromegaly.  Lungs are clear, good air entry, no wheezes, rhonchi or rales. S1 and S2 normal, no murmurs, regular rate and rhythm.  Abdomen soft without tenderness, guarding, mass or organomegaly.  Neurological is  normal, no focal findings.  BREAST EXAM: breasts appear normal, no suspicious masses, no skin or nipple changes or axillary nodes  PELVIC EXAM: VULVA: normal appearing vulva with no masses, tenderness or lesions, VAGINA: normal appearing vagina with normal color and discharge, no lesions, CERVIX: normal appearing cervix without discharge or lesions, UTERUS: uterus is normal size, shape, consistency and nontender, ADNEXA: normal adnexa in size, nontender and no masses, general pelvic tenderness noted after the examination is complete, WET MOUNT done - results: +hyphae Urinalysis pending, GC/Chlamydia probe obtained  Chaperone: Bonham present during the examination  ASSESSMENT:  30 y.o. 36 here for annual gynecologic exam  PLAN:   1.  Dyspareunia and pelvic pain.  We discussed the differential diagnosis could include vaginitis, STI, ovarian cyst, endometriosis.  Vaginal wet prep is positive for hyphae discussed treatment for vaginal candidiasis with fluconazole 150 mg every 72 hours x2 doses.  Might be causing some of her pelvic pain/inflammation symptoms.   GC/Chlamydia probe and urinalysis obtained and pending.  We will also follow-up on those results.  If symptoms do not resolve then she will let [id number] know.  2. Pap smear reported 2020 per patient.  She reports no history of abnormal Pap smears.  Next Pap smear due 2023 following the current guidelines recommending the 3 year interval. 3. Contraception.  Currently using Nexplanon but due to her symptoms and side effects which she attributes to use of this method she willing to try something else.  She says she has used an IUD in the past but had continued bleeding.  Not clear  if she was using a hormonal versus ParaGard IUD.  We also discussed birth control pills and the patch, NuvaRing and Depo shot.  She wanted to try the birth control patch so I gave her a prescription for this and explained how to use it along with risks of  thrombosis and other side effects.  I recommended that if she wants to get the Nexplanon removed that she make an appointment to do this and in the week before the appointment for that she could start the birth control patch so she has continued contraceptive effect.  She acknowledges understanding. 4. Breast exam normal.  Describes no family history of breast cancer or ovarian cancer syndromes.  Routine mammogram started age 74.  Encourage SBE. 5. Health maintenance.  No labs today as she normally has these completed with her primary care provider.      Theresia Majors MD 07/22/20

## 2020-07-23 LAB — URINALYSIS, COMPLETE W/RFL CULTURE
Bilirubin Urine: NEGATIVE
Glucose, UA: NEGATIVE
Hgb urine dipstick: NEGATIVE
Hyaline Cast: NONE SEEN /LPF
Ketones, ur: NEGATIVE
Leukocyte Esterase: NEGATIVE
Nitrites, Initial: NEGATIVE
RBC / HPF: NONE SEEN /HPF (ref 0–2)
Specific Gravity, Urine: 1.02 (ref 1.001–1.03)
pH: 6.5 (ref 5.0–8.0)

## 2020-07-23 LAB — CULTURE INDICATED

## 2020-07-23 LAB — URINE CULTURE
MICRO NUMBER:: 11379908
SPECIMEN QUALITY:: ADEQUATE

## 2020-07-23 LAB — C. TRACHOMATIS/N. GONORRHOEAE RNA
C. trachomatis RNA, TMA: NOT DETECTED
N. gonorrhoeae RNA, TMA: NOT DETECTED

## 2020-07-24 NOTE — Progress Notes (Signed)
Please tell patient urine culture is negative, gonorrhea and Chlamydia tests are negative

## 2020-07-28 ENCOUNTER — Ambulatory Visit: Payer: 59 | Admitting: Obstetrics and Gynecology

## 2020-08-27 ENCOUNTER — Ambulatory Visit: Payer: 59 | Admitting: Obstetrics and Gynecology

## 2020-09-09 LAB — WET PREP FOR TRICH, YEAST, CLUE
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
## 2021-07-29 ENCOUNTER — Encounter (HOSPITAL_COMMUNITY): Payer: Self-pay | Admitting: *Deleted

## 2021-07-29 ENCOUNTER — Emergency Department (HOSPITAL_COMMUNITY)
Admission: EM | Admit: 2021-07-29 | Discharge: 2021-07-29 | Disposition: A | Discharge status: Home / Self Care | Payer: 59 | Attending: Emergency Medicine | Admitting: Emergency Medicine

## 2021-07-29 ENCOUNTER — Other Ambulatory Visit: Payer: Self-pay

## 2021-07-29 DIAGNOSIS — Z23 Encounter for immunization: Secondary | ICD-10-CM | POA: Insufficient documentation

## 2021-07-29 DIAGNOSIS — L0231 Cutaneous abscess of buttock: Secondary | ICD-10-CM | POA: Insufficient documentation

## 2021-07-29 DIAGNOSIS — L0291 Cutaneous abscess, unspecified: Secondary | ICD-10-CM

## 2021-07-29 DIAGNOSIS — R509 Fever, unspecified: Secondary | ICD-10-CM | POA: Diagnosis present

## 2021-07-29 MED ORDER — SULFAMETHOXAZOLE-TRIMETHOPRIM 800-160 MG PO TABS: 1.0000 | ORAL_TABLET | Freq: Two times a day (BID) | ORAL | 0 refills | Status: AC

## 2021-07-29 MED ORDER — TETANUS-DIPHTH-ACELL PERTUSSIS 5-2.5-18.5 LF-MCG/0.5 IM SUSY
0.5000 mL | PREFILLED_SYRINGE | Freq: Once | INTRAMUSCULAR | Status: AC
  Administered 2021-07-29: 0.5 mL via INTRAMUSCULAR
  Filled 2021-07-29: qty 0.5

## 2021-07-29 MED ORDER — LIDOCAINE HCL (PF) 1 % IJ SOLN
5.0000 mL | Freq: Once | INTRAMUSCULAR | Status: AC
  Administered 2021-07-29: 5 mL
  Filled 2021-07-29: qty 30

## 2021-07-29 NOTE — ED Notes (Signed)
Dc instructions and scripts reviewed with pt no questions or concerns at this time. Will follow up in 3 days for wound check

## 2021-07-29 NOTE — ED Provider Triage Note (Signed)
Emergency Medicine Provider Triage Evaluation Note  Latasha Powell , a 31 y.o. female  was evaluated in triage.  Pt complains of having redness swelling and pain to her right buttocks.  She states that she thinks she was bit by a spider 2 days ago and states that she has had a temperature as high of 102 yesterday and took some Tylenol today as well fever.  States that it is painful to touch and painful to sit.  No nausea vomiting abdominal pain chest pain or difficulty breathing.  Review of Systems  Positive: Insect bite, fever Negative: Nausea, vomiting  Physical Exam  BP 139/89 (BP Location: Right Arm)    Pulse 83    Temp 98.4 F (36.9 C) (Oral)    Resp 18    Ht 5\' 4"  (1.626 m)    Wt 59.9 kg    SpO2 99%    BMI 22.66 kg/m  Gen:   Awake, no distress, but uncomfortable Resp:  Normal effort  MSK:   Moves extremities without difficulty  Other:  Pinpoint insect bite at the seat of the buttocks on the right side. Redness surrounding. No fluctuance of note.   Medical Decision Making  Medically screening exam initiated at 3:35 PM.  Appropriate orders placed.  Latasha Powell was informed that the remainder of the evaluation will be completed by another provider, this initial triage assessment does not replace that evaluation, and the importance of remaining in the ED until their evaluation is complete.  Exam limited by triage area lighting. Will need tdap (uncertain of last uptdate) and evaluation in well lit room. I don't see any signs of abscess.    Latasha Powell, [place] 07/29/21 1538

## 2021-07-29 NOTE — ED Provider Notes (Signed)
[facility]-EMERGENCY DEPT Provider Note   CSN: [id number] Arrival date & time: 07/29/21  1513     History  Chief Complaint  Patient presents with   Insect Bite    Latasha Powell is a 31 y.o. female.  A tender lesion on her right buttock she is noticed for 2 days.  Describes it as red and tender to touch.  She feels like is getting bigger.  Denies any previous similar lesions.  Describes subjective fevers at home.  No reports of vomiting or cough or diarrhea.      Home Medications Prior to Admission medications   Medication Sig Start Date End Date Taking? Authorizing Provider  sulfamethoxazole-trimethoprim (BACTRIM DS) 800-160 MG tablet Take 1 tablet by mouth 2 (two) times daily for 7 days. 07/29/21 08/05/21 Yes Cheryll Cockayne, MD  Ascorbic Acid (VITAMIN C PO) Take by mouth.    [provider]  norelgestromin-ethinyl estradiol (ORTHO EVRA) 150-35 MCG/24HR transdermal patch Place 1 patch onto the skin once a week. Withdraw from patch on 4th week 07/22/20   Theresia Majors, MD      Allergies    Patient has no known allergies.    Review of Systems   Review of Systems  Constitutional:  Negative for appetite change.  HENT:  Negative for ear pain.   Eyes:  Negative for pain.  Respiratory:  Negative for cough.   Cardiovascular:  Negative for chest pain.  Gastrointestinal:  Negative for abdominal pain.  Genitourinary:  Negative for flank pain.  Musculoskeletal:  Negative for back pain.  Skin:  Negative for rash.  Neurological:  Negative for headaches.   Physical Exam Updated Vital Signs BP 139/89 (BP Location: Right Arm)    Pulse 83    Temp 98.4 F (36.9 C) (Oral)    Resp 18    Ht 5\' 4"  (1.626 m)    Wt 59.9 kg    SpO2 99%    BMI 22.66 kg/m  Physical Exam Constitutional:      General: She is not in acute distress.    Appearance: Normal appearance.  HENT:     Head: Normocephalic.     Nose: Nose normal.  Eyes:     Extraocular  Movements: Extraocular movements intact.  Cardiovascular:     Rate and Rhythm: Normal rate.  Pulmonary:     Effort: Pulmonary effort is normal.  Musculoskeletal:        General: Normal range of motion.     Cervical back: Normal range of motion.  Skin:    Comments: Right lower buttock raised tender lesion approximately 1.5 x 1.5 cm tender to touch with mild surrounding erythema of approximately 1 cm.  Neurological:     General: No focal deficit present.     Mental Status: She is alert. Mental status is at baseline.    ED Results / Procedures / Treatments   Labs (all labs ordered are listed, but only abnormal results are displayed) Labs Reviewed - No data to display  EKG None  Radiology No results found.  Procedures . Incision and Drainage  Date/Time: 07/29/2021 5:16 PM Performed by: 09/26/2021, MD Authorized by: Cheryll Cockayne, MD   Comments:     Right buttock abscess incision and drainage.  Site prepped with Betadine x1.  23-gauge needle used to instill approximately 1.5 cc of 1% lidocaine for local anesthesia.  11 blade used to make a 1 cm linear incision.  Small amount of purulent material expressed approximately  25 cc.  Incision explored with Q-tip.  Does not appear deep, no packing placed.  Wound is dressed.  Advised wound check in 2 to 3 days with her primary care doctor or urgent care or here in the ER.    Medications Ordered in ED Medications  Tdap (BOOSTRIX) injection 0.5 mL (0.5 mLs Intramuscular Given 07/29/21 1627)  lidocaine (PF) (XYLOCAINE) 1 % injection 5 mL (5 mLs Infiltration Given by Other 07/29/21 1712)    ED Course/ Medical Decision Making/ A&P                           Medical Decision Making Amount and/or Complexity of Data Reviewed Independent Historian: friend  Risk Prescription drug management.    Patient presents with right buttock abscess, minimal surrounding erythema noted.  Incision and drainage performed here in the ER.  Antibiotic  prescription provided advised return in 2 to 3 days for wound check.  Advised immediate return for fevers worsening symptoms or any additional concerns.        Final Clinical Impression(s) / ED Diagnoses Final diagnoses:  Abscess    Rx / DC Orders ED Discharge Orders          Ordered    sulfamethoxazole-trimethoprim (BACTRIM DS) 800-160 MG tablet  2 times daily        07/29/21 1719              Cheryll Cockayne, MD 07/29/21 1719

## 2021-07-29 NOTE — Discharge Instructions (Signed)
Keep the wound covered for the next 5 days.  Wash the area gently with soap and water twice daily.  Return in 2 to 3 days to the ER or to urgent care or to your primary care doctor for a wound check.  However if she has fevers increased redness increased pain return immediately back to the ER.

## 2021-07-29 NOTE — ED Triage Notes (Addendum)
Insect bite on rt buttocks, pt states it is getting bigger, describes red with black spot in middle.Pt reports headache, fever and chills yesterday and today

## 2021-09-15 ENCOUNTER — Ambulatory Visit (INDEPENDENT_AMBULATORY_CARE_PROVIDER_SITE_OTHER): Payer: 59 | Admitting: Nurse Practitioner

## 2021-09-15 ENCOUNTER — Other Ambulatory Visit (HOSPITAL_COMMUNITY)
Admission: RE | Admit: 2021-09-15 | Discharge: 2021-09-15 | Disposition: A | Discharge status: Home / Self Care | Payer: 59 | Source: Ambulatory Visit | Attending: Nurse Practitioner | Admitting: Nurse Practitioner

## 2021-09-15 ENCOUNTER — Encounter: Payer: Self-pay | Admitting: Nurse Practitioner

## 2021-09-15 ENCOUNTER — Other Ambulatory Visit: Payer: Self-pay

## 2021-09-15 VITALS — BP 116/74 | Ht 62.0 in | Wt 138.0 lb

## 2021-09-15 DIAGNOSIS — Z01419 Encounter for gynecological examination (general) (routine) without abnormal findings: Secondary | ICD-10-CM | POA: Diagnosis present

## 2021-09-15 DIAGNOSIS — Z3046 Encounter for surveillance of implantable subdermal contraceptive: Secondary | ICD-10-CM | POA: Diagnosis not present

## 2021-09-15 NOTE — Progress Notes (Signed)
° °  Latasha Powell 18-Jul-1991 [id number]   History:  31 y.o. [id number] presents for annual exam. Nexplanon 01/2018. Amenorrheic. Has had nausea, bloating and headaches since insertion and would like to discuss removal and switching to different method that will allow menses. Normal pap history. Interpreter present during visit.   Gynecologic History No LMP recorded. Patient has had an implant.   Contraception/Family planning: Nexplanon Sexually active: Yes  Health Maintenance Last Pap: 2020 per patient. Results were: Normal Last mammogram: Not indicated Last colonoscopy: Not indicated Last Dexa: Not indicated  Past medical history, past surgical history, family history and social history were all reviewed and documented in the EPIC chart. Married. 2 daughters ages 88 and 25. Owns painting/remodeling company, does admin work.   ROS:  A ROS was performed and pertinent positives and negatives are included.  Exam:  Vitals:   09/15/21 1506  BP: 116/74  Weight: 138 lb (62.6 kg)  Height: 5\' 2"  (1.575 m)   Body mass index is 25.24 kg/m.  General appearance:  Normal Thyroid:  Symmetrical, normal in size, without palpable masses or nodularity. Respiratory  Auscultation:  Clear without wheezing or rhonchi Cardiovascular  Auscultation:  Regular rate, without rubs, murmurs or gallops  Edema/varicosities:  Not grossly evident Abdominal  Soft,nontender, without masses, guarding or rebound.  Liver/spleen:  No organomegaly noted  Hernia:  None appreciated  Skin  Inspection:  Grossly normal Breasts: Examined lying and sitting.   Right: Without masses, retractions, nipple discharge or axillary adenopathy.   Left: Without masses, retractions, nipple discharge or axillary adenopathy. Genitourinary   Inguinal/mons:  Normal without inguinal adenopathy  External genitalia:  Normal appearing vulva with no masses, tenderness, or lesions  BUS/Urethra/Skene's glands:  Normal  Vagina:   Normal appearing with normal color and discharge, no lesions  Cervix:  Normal appearing without discharge or lesions  Uterus:  Normal in size, shape and contour.  Midline and mobile, nontender  Adnexa/parametria:     Rt: Normal in size, without masses or tenderness.   Lt: Normal in size, without masses or tenderness.  Anus and perineum: Normal  Digital rectal exam: Not indicated  Patient informed chaperone available to be present for breast and pelvic exam. Patient has requested no chaperone to be present. Patient has been advised what will be completed during breast and pelvic exam.   Assessment/Plan:  31 y.o. 32 for annual exam.   Well female exam with routine gynecological exam - Plan: Cytology - PAP( [place]). Education provided on SBEs, importance of preventative screenings, current guidelines, high calcium diet, regular exercise, and multivitamin daily.   Encounter for surveillance of implantable subdermal contraceptive - Nexplanon inserted 01/2021. Amenorrheic and wants contraception that she will have menses with. She complains of nausea, bloating, and headaches since insertion. She will schedule removal. She is interested in patch.   Screening for cervical cancer - Normal Pap history. Pap today.   Return in 1 year for annual.     02/2021 DNP, 3:20 PM 09/15/2021

## 2021-09-17 LAB — CYTOLOGY - PAP
Comment: NEGATIVE
Diagnosis: NEGATIVE
High risk HPV: NEGATIVE

## 2021-10-15 ENCOUNTER — Encounter: Payer: Self-pay | Admitting: Nurse Practitioner

## 2021-10-15 ENCOUNTER — Ambulatory Visit: Payer: 59 | Admitting: Nurse Practitioner

## 2021-10-15 VITALS — BP 116/74

## 2021-10-15 DIAGNOSIS — Z3046 Encounter for surveillance of implantable subdermal contraceptive: Secondary | ICD-10-CM | POA: Diagnosis not present

## 2021-10-15 DIAGNOSIS — Z30016 Encounter for initial prescription of transdermal patch hormonal contraceptive device: Secondary | ICD-10-CM | POA: Diagnosis not present

## 2021-10-15 MED ORDER — NORELGESTROMIN-ETH ESTRADIOL 150-35 MCG/24HR TD PTWK: 1.0000 | MEDICATED_PATCH | TRANSDERMAL | 3 refills | Status: DC

## 2021-10-15 NOTE — Progress Notes (Signed)
31 y.o. E3P2951 Hispanic Married female presents for Nexplanon removal.  She has been experiencing headache, nausea, bloating.  She has decided to use patch for future contraception.  Procedure, risks and benefits have all been explained.  She has the following questions today:  None. Interpreter present during visit.  ? ?LMP:  Unkown  ? ?After all questions were answered, consent was obtained.   ? ?Past Medical History:  ?Diagnosis Date  ? Medical history non-contributory   ? ? ?Past Surgical History:  ?Procedure Laterality Date  ? APPENDECTOMY    ? CAPSULAR RELEASE Right 03/20/2020  ? Procedure: RIGHT ELBOW ARTHROTOMY W/CAPSULAR RELEASE;  Surgeon: Bjorn Pippin, MD;  Location: [facility];  Service: Orthopedics;  Laterality: Right;  ? CESAREAN SECTION    ? CESAREAN SECTION    ? X2  ? I & D EXTREMITY Right 10/25/2019  ? Procedure: Right elbow joint incision debridement with excisional debridement of bone and tendon as well as subcutaneous tissue.  Right deep wound incision debridement with excisional debridement of skin ,muscle, and fascia. ;  Surgeon: Bjorn Pippin, MD;  Location: [facility];  Service: Orthopedics;  Laterality: Right;  ? I & D EXTREMITY Right 10/29/2019  ? Procedure: IRRIGATION AND DEBRIDEMENT EXTREMITY;  Surgeon: Bjorn Pippin, MD;  Location: [facility] OR;  Service: Orthopedics;  Laterality: Right;  ? TRICEPS TENDON REPAIR Right 10/29/2019  ? Procedure: TRICEPS TENDON REPAIR;  Surgeon: Bjorn Pippin, MD;  Location: [facility] OR;  Service: Orthopedics;  Laterality: Right;  ? ULNAR NERVE TRANSPOSITION Right 03/20/2020  ? Procedure: NEUROPLASTY AND TRANSPOSITION ULNAR NERVE AT ELBOW;  Surgeon: Bjorn Pippin, MD;  Location: [facility];  Service: Orthopedics;  Laterality: Right;  ? ? ?No current outpatient medications on file prior to visit.  ? ?No current facility-administered medications on file prior to visit.  ? ?No Known Allergies ? ?Vitals:  ? 10/15/21 0932  ?BP: 116/74  ? ?Physical  Exam ? ?Procedure: Patient placed supine on exam table with her left arm flexed at the elbow. The prior insertion site was located and the Nexplanon rod was palpated.  Area cleansed with Betadine x 3 and draped in normal sterile fashion.  Insertion site and surrounding tissue anesthetized with 1% Lidocaine without epinephrine, 2cc total used.  Small incision made with #11 blade.  Nexplanon removed without difficulty.  Steri-strips were applied and pressure dressing placed over the site.  Entire procedure performed with sterile technique.  Pt tolerated procedure well. ? ?Assessment: Nexplanon removal ? ?Plan:  Post procedure instructions reviewed with pt.  Questions answered.  Pt knows to call with any concerns or questions.  ?New contraception:  Xulane patches  ?

## 2021-11-11 ENCOUNTER — Telehealth: Payer: Self-pay

## 2021-11-11 NOTE — Telephone Encounter (Signed)
Patient called in voice mail. States she needs to ask about getting a refill and also has a question.  Will route to [place] for [place]. ?

## 2021-11-11 NOTE — Telephone Encounter (Signed)
Berna Spare A, CMA  You 5 minutes ago (11:28 AM)  ? ?Patient called asking for a refill on her medication. I told her that her rx shows she still has 3 refills left. She will call her pharmacy to fill rx.    ? ?

## 2021-12-15 ENCOUNTER — Ambulatory Visit (INDEPENDENT_AMBULATORY_CARE_PROVIDER_SITE_OTHER): Payer: 59 | Admitting: Nurse Practitioner

## 2021-12-15 ENCOUNTER — Encounter: Payer: Self-pay | Admitting: Nurse Practitioner

## 2021-12-15 VITALS — BP 116/76

## 2021-12-15 DIAGNOSIS — Z7251 High risk heterosexual behavior: Secondary | ICD-10-CM

## 2021-12-15 DIAGNOSIS — Z3009 Encounter for other general counseling and advice on contraception: Secondary | ICD-10-CM

## 2021-12-15 LAB — PREGNANCY, URINE: Preg Test, Ur: NEGATIVE

## 2021-12-15 NOTE — Progress Notes (Signed)
   Acute Office Visit  Subjective:    Patient ID: Latasha Powell, female    DOB: 02/21/1991, 31 y.o.   MRN: [id number]   HPI 31 y.o. presents today to discuss birth control. Nexplanon removed in March and started on Xulane patches. She stopped patches 3 weeks ago due to itching at site. Unprotected intercourse since then, most recently last night. No period since Nexplanon removal. Interpreter present during visit.    Review of Systems  Constitutional: Negative.   Genitourinary: Negative.       Objective:    Physical Exam Constitutional:      Appearance: Normal appearance.  GU: Not indicated  BP 116/76   LMP  (LMP Unknown) Comment: No periods with Patch Wt Readings from Last 3 Encounters:  09/15/21 138 lb (62.6 kg)  07/29/21 132 lb (59.9 kg)  07/22/20 138 lb (62.6 kg)        Patient informed chaperone available to be present for breast and/or pelvic exam. Patient has requested no chaperone to be present. Patient has been advised what will be completed during breast and pelvic exam.   Assessment & Plan:   Problem List Items Addressed This Visit   None Visit Diagnoses     Encounter for other general counseling or advice on contraception    -  Primary   Unprotected sexual intercourse       Relevant Orders   Pregnancy, urine      Plan: Contraceptive options were reviewed, including hormonal methods, both combination (pill, patch, vaginal ring) and progesterone-only (pill, Depo Provera and Nexplanon), intrauterine devices (Mirena, Langeloth, [place], and Woodruff), Phexxi, barrier methods (condoms, diaphragm) and female/female sterilization. The mechanisms, risks, benefits and side effects of all methods were discussed. She would like Depo Provera. Will return in 3 weeks for UPT and first dose if appropriate. She is aware to abstain or strict condom use until then.       Olivia Mackie DNP, 10:13 AM 12/15/2021

## 2021-12-16 ENCOUNTER — Other Ambulatory Visit: Payer: Self-pay | Admitting: Nurse Practitioner

## 2021-12-16 ENCOUNTER — Telehealth: Payer: Self-pay

## 2021-12-16 DIAGNOSIS — Z30013 Encounter for initial prescription of injectable contraceptive: Secondary | ICD-10-CM

## 2021-12-16 MED ORDER — MEDROXYPROGESTERONE ACETATE 150 MG/ML IM SUSP: 150.0000 mg | Freq: Once | INTRAMUSCULAR | 0 refills | Status: DC

## 2021-12-16 NOTE — Telephone Encounter (Signed)
Would she prefer to pick it up at her pharmacy? She may not be aware we can provide it here as well.

## 2021-12-16 NOTE — Telephone Encounter (Signed)
Sent.  Thank you.

## 2021-12-16 NOTE — Telephone Encounter (Signed)
Guy Sandifer, RMA "I asked her yesterday at check out she wanted it to be called in to her pharmacy. "

## 2021-12-16 NOTE — Telephone Encounter (Signed)
Jerilynn Mages  [facility] Triage "Return in about 3 weeks (around 01/05/2022) for nurse visit - depo provera." Per TW note 12-15-21 / Appt scheduled 01/08/22. Per patient Depo Provera to be called in to her pharmacy.   Rx needed.

## 2021-12-17 ENCOUNTER — Telehealth: Payer: Self-pay

## 2021-12-17 ENCOUNTER — Other Ambulatory Visit: Payer: Self-pay

## 2021-12-17 NOTE — Telephone Encounter (Signed)
Alex at [employer] called wanting to clarify day's supply for D-P 150 mg.  I called him back and clarified and he added the the Rx  "every 3 months".  "S:  Inject 1 ml (150 mg total) into the muscle for 1 dose every 3 months."

## 2021-12-24 ENCOUNTER — Encounter (HOSPITAL_COMMUNITY): Payer: Self-pay

## 2021-12-24 ENCOUNTER — Emergency Department (HOSPITAL_COMMUNITY)
Admission: EM | Admit: 2021-12-24 | Discharge: 2021-12-24 | Disposition: A | Discharge status: Home / Self Care | Payer: 59 | Attending: Emergency Medicine | Admitting: Emergency Medicine

## 2021-12-24 ENCOUNTER — Other Ambulatory Visit: Payer: Self-pay

## 2021-12-24 DIAGNOSIS — N9489 Other specified conditions associated with female genital organs and menstrual cycle: Secondary | ICD-10-CM | POA: Insufficient documentation

## 2021-12-24 DIAGNOSIS — R110 Nausea: Secondary | ICD-10-CM | POA: Diagnosis not present

## 2021-12-24 DIAGNOSIS — N939 Abnormal uterine and vaginal bleeding, unspecified: Secondary | ICD-10-CM | POA: Insufficient documentation

## 2021-12-24 DIAGNOSIS — R1030 Lower abdominal pain, unspecified: Secondary | ICD-10-CM | POA: Diagnosis not present

## 2021-12-24 LAB — COMPREHENSIVE METABOLIC PANEL
ALT: 25 U/L (ref 0–44)
AST: 24 U/L (ref 15–41)
Albumin: 4.2 g/dL (ref 3.5–5.0)
Alkaline Phosphatase: 61 U/L (ref 38–126)
Anion gap: 7 (ref 5–15)
BUN: 8 mg/dL (ref 6–20)
CO2: 24 mmol/L (ref 22–32)
Calcium: 9.1 mg/dL (ref 8.9–10.3)
Chloride: 109 mmol/L (ref 98–111)
Creatinine, Ser: 0.71 mg/dL (ref 0.44–1.00)
GFR, Estimated: >60 mL/min (ref >60)
Glucose, Bld: 92 mg/dL (ref 70–99)
Potassium: 3.5 mmol/L (ref 3.5–5.1)
Sodium: 140 mmol/L (ref 135–145)
Total Bilirubin: 0.7 mg/dL (ref 0.3–1.2)
Total Protein: 7.6 g/dL (ref 6.5–8.1)

## 2021-12-24 LAB — URINALYSIS, ROUTINE W REFLEX MICROSCOPIC
Bilirubin Urine: NEGATIVE
Glucose, UA: NEGATIVE mg/dL
Ketones, ur: 20 mg/dL — AB
Leukocytes,Ua: NEGATIVE
Nitrite: NEGATIVE
Protein, ur: NEGATIVE mg/dL
Specific Gravity, Urine: 1.015 (ref 1.005–1.030)
pH: 6 (ref 5.0–8.0)

## 2021-12-24 LAB — I-STAT BETA HCG BLOOD, ED (MC, WL, AP ONLY): I-stat hCG, quantitative: <5 m[IU]/mL (ref <5)

## 2021-12-24 LAB — LIPASE, BLOOD: Lipase: 36 U/L (ref 11–51)

## 2021-12-24 LAB — CBC
HCT: 39.3 % (ref 36.0–46.0)
Hemoglobin: 13.2 g/dL (ref 12.0–15.0)
MCH: 30.1 pg (ref 26.0–34.0)
MCHC: 33.6 g/dL (ref 30.0–36.0)
MCV: 89.5 fL (ref 80.0–100.0)
Platelets: 321 10*3/uL (ref 150–400)
RBC: 4.39 MIL/uL (ref 3.87–5.11)
RDW: 12.7 % (ref 11.5–15.5)
WBC: 6.4 10*3/uL (ref 4.0–10.5)
nRBC: 0 % (ref 0.0–0.2)

## 2021-12-24 MED ORDER — ONDANSETRON 4 MG PO TBDP
4.0000 mg | ORAL_TABLET | Freq: Once | ORAL | Status: AC | PRN
  Administered 2021-12-24: 4 mg via ORAL
  Filled 2021-12-24: qty 1
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
## 2022-01-08 ENCOUNTER — Ambulatory Visit (INDEPENDENT_AMBULATORY_CARE_PROVIDER_SITE_OTHER): Payer: 59

## 2022-01-08 DIAGNOSIS — Z3042 Encounter for surveillance of injectable contraceptive: Secondary | ICD-10-CM

## 2022-01-08 LAB — PREGNANCY, URINE: Preg Test, Ur: NEGATIVE

## 2022-01-08 MED ORDER — MEDROXYPROGESTERONE ACETATE 150 MG/ML IM SUSP
150.0000 mg | Freq: Once | INTRAMUSCULAR | Status: AC
  Administered 2022-01-08: 150 mg via INTRAMUSCULAR

## 2022-03-29 ENCOUNTER — Ambulatory Visit: Payer: 59

## 2022-04-09 ENCOUNTER — Ambulatory Visit: Payer: 59

## 2022-06-30 ENCOUNTER — Ambulatory Visit (INDEPENDENT_AMBULATORY_CARE_PROVIDER_SITE_OTHER): Payer: Commercial Managed Care - HMO | Admitting: *Deleted

## 2022-06-30 DIAGNOSIS — Z3042 Encounter for surveillance of injectable contraceptive: Secondary | ICD-10-CM

## 2022-06-30 DIAGNOSIS — N912 Amenorrhea, unspecified: Secondary | ICD-10-CM

## 2022-06-30 LAB — PREGNANCY, URINE: Preg Test, Ur: NEGATIVE

## 2022-06-30 MED ORDER — MEDROXYPROGESTERONE ACETATE 150 MG/ML IM SUSP
150.0000 mg | Freq: Once | INTRAMUSCULAR | Status: AC
  Administered 2022-06-30: 150 mg via INTRAMUSCULAR

## 2022-07-29 ENCOUNTER — Ambulatory Visit: Payer: PRIVATE HEALTH INSURANCE | Admitting: Nurse Practitioner

## 2022-08-01 ENCOUNTER — Encounter (HOSPITAL_COMMUNITY): Payer: Self-pay

## 2022-08-01 ENCOUNTER — Inpatient Hospital Stay (HOSPITAL_COMMUNITY)
Admission: AD | Admit: 2022-08-01 | Discharge: 2022-08-01 | Disposition: A | Discharge status: Home / Self Care | Payer: PRIVATE HEALTH INSURANCE | Attending: Obstetrics and Gynecology | Admitting: Obstetrics and Gynecology

## 2022-08-01 ENCOUNTER — Inpatient Hospital Stay (HOSPITAL_COMMUNITY): Payer: PRIVATE HEALTH INSURANCE

## 2022-08-01 DIAGNOSIS — O200 Threatened abortion: Secondary | ICD-10-CM | POA: Insufficient documentation

## 2022-08-01 DIAGNOSIS — Z3A01 Less than 8 weeks gestation of pregnancy: Secondary | ICD-10-CM | POA: Insufficient documentation

## 2022-08-01 DIAGNOSIS — O208 Other hemorrhage in early pregnancy: Secondary | ICD-10-CM | POA: Insufficient documentation

## 2022-08-01 DIAGNOSIS — O209 Hemorrhage in early pregnancy, unspecified: Secondary | ICD-10-CM | POA: Insufficient documentation

## 2022-08-01 DIAGNOSIS — O468X1 Other antepartum hemorrhage, first trimester: Secondary | ICD-10-CM

## 2022-08-01 DIAGNOSIS — O26899 Other specified pregnancy related conditions, unspecified trimester: Secondary | ICD-10-CM

## 2022-08-01 LAB — WET PREP, GENITAL
Sperm: NONE SEEN
Trich, Wet Prep: NONE SEEN
WBC, Wet Prep HPF POC: <10 (ref <10)
Yeast Wet Prep HPF POC: NONE SEEN

## 2022-08-01 LAB — URINALYSIS, ROUTINE W REFLEX MICROSCOPIC
Bilirubin Urine: NEGATIVE
Glucose, UA: NEGATIVE mg/dL
Ketones, ur: NEGATIVE mg/dL
Leukocytes,Ua: NEGATIVE
Nitrite: NEGATIVE
Protein, ur: NEGATIVE mg/dL
Specific Gravity, Urine: 1.023 (ref 1.005–1.030)
pH: 5 (ref 5.0–8.0)

## 2022-08-01 LAB — CBC
HCT: 39.7 % (ref 36.0–46.0)
Hemoglobin: 13.4 g/dL (ref 12.0–15.0)
MCH: 29.9 pg (ref 26.0–34.0)
MCHC: 33.8 g/dL (ref 30.0–36.0)
MCV: 88.6 fL (ref 80.0–100.0)
Platelets: 297 10*3/uL (ref 150–400)
RBC: 4.48 MIL/uL (ref 3.87–5.11)
RDW: 12.5 % (ref 11.5–15.5)
WBC: 9.7 10*3/uL (ref 4.0–10.5)
nRBC: 0 % (ref 0.0–0.2)

## 2022-08-01 LAB — POCT PREGNANCY, URINE: Preg Test, Ur: POSITIVE — AB

## 2022-08-01 LAB — HCG, QUANTITATIVE, PREGNANCY: hCG, Beta Chain, Quant, S: 105810 m[IU]/mL — ABNORMAL HIGH (ref <5)

## 2022-08-01 LAB — HIV ANTIBODY (ROUTINE TESTING W REFLEX): HIV Screen 4th Generation wRfx: NONREACTIVE

## 2022-08-01 MED ORDER — METOCLOPRAMIDE HCL 10 MG PO TABS: 10.0000 mg | ORAL_TABLET | Freq: Four times a day (QID) | ORAL | 3 refills | Status: DC

## 2022-08-01 MED ORDER — PREPLUS 27-1 MG PO TABS: 1.0000 | ORAL_TABLET | Freq: Every day | ORAL | 13 refills | Status: AC

## 2022-08-01 NOTE — Discharge Instructions (Signed)
Regresar a MAU:  Si tiene un sangrado ms intenso que empapa ms de 2 toallas sanitarias por hora durante una hora o ms  Si sangra tanto que siente que podra desmayarse o se desmaya  Si tiene dolor abdominal significativo que no mejora con Tylenol 1000 mg cada 8 horas segn sea necesario para Conservation officer, historic buildings.  Si presenta fiebre > 100.5

## 2022-08-01 NOTE — MAU Provider Note (Signed)
History     CSN: [id number]  Arrival date and time: 08/01/22 1129   Event Date/Time   First Provider Initiated Contact with Patient 08/01/22 1255      Chief Complaint  Patient presents with   Abdominal Pain   Vaginal Bleeding   HPI [name] is a 32 y.o. year old G20P2012 female at [redacted]w[redacted]d weeks gestation who presents to MAU reporting cramping x 1 week, "stabbing" breast pain and VB since this AM. She reports having  SAB in June 2023. She had a Nexplanon removed in July 2023, had Depo 01/2022. She reports missing the Depo injection in November, but had one 06/21/2022. She reports she had a negative UPT prior to being given the injection. Her LMP is reported as 05/08/2022. She denies any recent SI. Her spouse is present and contributing to the history taking.  OB History     Gravida  4   Para  2   Term  2   Preterm      AB  1   Living  2      SAB  1   IAB      Ectopic  0   Multiple      Live Births  1           Past Medical History:  Diagnosis Date   Medical history non-contributory     Past Surgical History:  Procedure Laterality Date   APPENDECTOMY     CAPSULAR RELEASE Right 03/20/2020   Procedure: RIGHT ELBOW ARTHROTOMY W/CAPSULAR RELEASE;  Surgeon: Bjorn Pippin, MD;  Location: [facility];  Service: Orthopedics;  Laterality: Right;   CESAREAN SECTION     CESAREAN SECTION     X2   I & D EXTREMITY Right 10/25/2019   Procedure: Right elbow joint incision debridement with excisional debridement of bone and tendon as well as subcutaneous tissue.  Right deep wound incision debridement with excisional debridement of skin ,muscle, and fascia. ;  Surgeon: Bjorn Pippin, MD;  Location: [facility] OR;  Service: Orthopedics;  Laterality: Right;   I & D EXTREMITY Right 10/29/2019   Procedure: IRRIGATION AND DEBRIDEMENT EXTREMITY;  Surgeon: Bjorn Pippin, MD;  Location: [facility];  Service: Orthopedics;  Laterality: Right;   TRICEPS TENDON REPAIR  Right 10/29/2019   Procedure: TRICEPS TENDON REPAIR;  Surgeon: Bjorn Pippin, MD;  Location: [facility];  Service: Orthopedics;  Laterality: Right;   ULNAR NERVE TRANSPOSITION Right 03/20/2020   Procedure: NEUROPLASTY AND TRANSPOSITION ULNAR NERVE AT ELBOW;  Surgeon: Bjorn Pippin, MD;  Location: [facility];  Service: Orthopedics;  Laterality: Right;    History reviewed. No pertinent family history.  Social History   Tobacco Use   Smoking status: Former    Types: E-cigarettes   Smokeless tobacco: Never  Building services engineer Use: Former   Substances: Nicotine   Devices: QUIT 1 YEAR AGO  Substance Use Topics   Alcohol use: Not Currently    Comment: SOCIAL   Drug use: Never    Allergies: No Known Allergies  Medications Prior to Admission  Medication Sig Dispense Refill Last Dose   medroxyPROGESTERone (DEPO-PROVERA) 150 MG/ML injection Inject 1 mL (150 mg total) into the muscle once for 1 dose. 1 mL 0     Review of Systems  Constitutional: Negative.   HENT: Negative.    Eyes: Negative.   Respiratory: Negative.    Cardiovascular: Negative.   Gastrointestinal: Negative.  Endocrine: Negative.   Genitourinary:  Positive for pelvic pain (cramping) and vaginal bleeding.  Musculoskeletal: Negative.   Skin: Negative.   Allergic/Immunologic: Negative.   Neurological: Negative.   Hematological: Negative.   Psychiatric/Behavioral: Negative.     Physical Exam   Blood pressure 133/78, pulse 100, temperature 98.5 F (36.9 C), temperature source Oral, resp. rate 16, SpO2 100 %.  Physical Exam Vitals and nursing note reviewed.  Constitutional:      Appearance: Normal appearance. She is normal weight.  Cardiovascular:     Rate and Rhythm: Normal rate.  Pulmonary:     Effort: Pulmonary effort is normal.  Abdominal:     General: Abdomen is flat.     Palpations: Abdomen is soft.  Genitourinary:    Comments: Swabs obtained using blind technique by RN Musculoskeletal:         General: Normal range of motion.  Skin:    General: Skin is warm and dry.  Neurological:     Mental Status: She is alert and oriented to person, place, and time.  Psychiatric:        Mood and Affect: Mood normal.        Behavior: Behavior normal.        Thought Content: Thought content normal.        Judgment: Judgment normal.    MAU Course  Procedures  MDM CCUA UPT CBC ABO/Rh HCG Wet Prep GC/CT -- pending HIV -- pending OB < 14 wks [place] with TV  Results for orders placed or performed during the hospital encounter of 08/01/22 (from the past 24 hour(s))  Pregnancy, urine POC     Status: Abnormal   Collection Time: 08/01/22 11:38 AM  Result Value Ref Range   Preg Test, Ur POSITIVE (A) NEGATIVE  Urinalysis, Routine w reflex microscopic Urine, Clean Catch     Status: Abnormal   Collection Time: 08/01/22 11:38 AM  Result Value Ref Range   Color, Urine YELLOW YELLOW   APPearance HAZY (A) CLEAR   Specific Gravity, Urine 1.023 1.005 - 1.030   pH 5.0 5.0 - 8.0   Glucose, UA NEGATIVE NEGATIVE mg/dL   Hgb urine dipstick SMALL (A) NEGATIVE   Bilirubin Urine NEGATIVE NEGATIVE   Ketones, ur NEGATIVE NEGATIVE mg/dL   Protein, ur NEGATIVE NEGATIVE mg/dL   Nitrite NEGATIVE NEGATIVE   Leukocytes,Ua NEGATIVE NEGATIVE   RBC / HPF 0-5 0 - 5 RBC/hpf   WBC, UA 0-5 0 - 5 WBC/hpf   Bacteria, UA RARE (A) NONE SEEN   Squamous Epithelial / HPF 0-5 0 - 5 /HPF   Mucus PRESENT   Wet prep, genital     Status: Abnormal   Collection Time: 08/01/22 12:11 PM   Specimen: Vaginal  Result Value Ref Range   Yeast Wet Prep HPF POC NONE SEEN NONE SEEN   Trich, Wet Prep NONE SEEN NONE SEEN   Clue Cells Wet Prep HPF POC PRESENT (A) NONE SEEN   WBC, Wet Prep HPF POC <10 <10   Sperm NONE SEEN   CBC     Status: None   Collection Time: 08/01/22 12:16 PM  Result Value Ref Range   WBC 9.7 4.0 - 10.5 K/uL   RBC 4.48 3.87 - 5.11 MIL/uL   Hemoglobin 13.4 12.0 - 15.0 g/dL   HCT 39.7 36.0 - 46.0 %    MCV 88.6 80.0 - 100.0 fL   MCH 29.9 26.0 - 34.0 pg   MCHC 33.8 30.0 - 36.0 g/dL  RDW 12.5 11.5 - 15.5 %   Platelets 297 150 - 400 K/uL   nRBC 0.0 0.0 - 0.2 %  hCG, quantitative, pregnancy     Status: Abnormal   Collection Time: 08/01/22 12:16 PM  Result Value Ref Range   hCG, Beta Chain, Quant, S 105,810 (H) <5 mIU/mL  HIV Antibody (routine testing w rflx)     Status: None   Collection Time: 08/01/22 12:16 PM  Result Value Ref Range   HIV Screen 4th Generation wRfx Non Reactive Non Reactive    US OB LESS THAN 14 WEEKS WITH OB TRANSVAGINAL  Result Date: 08/01/2022 CLINICAL DATA:  Six weeks pregnant?  Spotting. EXAM: OBSTETRIC <14 WK [place] AND TRANSVAGINAL OB US TECHNIQUE: Both transabdominal and transvaginal ultrasound examinations were performed for complete evaluation of the gestation as well as the maternal uterus, adnexal regions, and pelvic cul-de-sac. Transvaginal technique was performed to assess early pregnancy. COMPARISON:  None Available. FINDINGS: Intrauterine gestational sac: Single Yolk sac:  Visualized. Embryo:  Visualized. Cardiac Activity: Visualized. Heart Rate: 137 bpm MSD:   mm    w     d CRL:  9.5 mm   7 w   0 d                  [place] EDC: 03/20/2023 Subchorionic hemorrhage:  Small Maternal uterus/adnexae: Maternal ovaries appear grossly normal and there is no mass or free fluid identified within either adnexal region. IMPRESSION: 1. Single live intrauterine pregnancy with estimated gestational age of [redacted] weeks and 0 days. 2. Small subchorionic hemorrhage. 3. No mass or free fluid within either adnexal region. Electronically Signed   By: Franki Cabot M.D.   On: 08/01/2022 13:05     Assessment and Plan  1. Vaginal bleeding affecting early pregnancy - Information provided on vaginal bleeding - Return to MAU: If you have heavier bleeding that soaks through more that 2 pads per hour for an hour or more If you bleed so much that you feel like you might pass out or you do pass out If  you have significant abdominal pain that is not improved with Tylenol 1000 mg every 8 hours as needed for pain If you develop a fever > 100.5   2. Cramping complicating pregnancy, antepartum - Information provided on abdominal pain pregnancy   3. Subchorionic hematoma in first trimester, single or unspecified fetus - Information provided on [facility]   4. Threatened miscarriage in early pregnancy - Information provided on threatened miscarriage   5. [redacted] weeks gestation of pregnancy   - Discharge patient - Patient verbalized an understanding of the plan of care and agrees.   Laury Deep, CNM 08/01/2022, 12:55 PM

## 2022-08-01 NOTE — MAU Note (Signed)
.  Latasha Powell is a 32 y.o. at Unknown here in MAU reporting: pt reports to mau with c/o cramping for the past week and vag spotting that started this morning.  Pt also reports "stabbing pain in breast".  Pt states she had nexplanon removed in July, and depo given on 12/4 as well as a neg preg test.   LMP: 10/21 Onset of complaint: today Pain score: 5/10 Vitals:   08/01/22 1143  BP: 133/78  Pulse: 100  Resp: 16  Temp: 98.5 F (36.9 C)  SpO2: 100%      Lab orders placed from triage:   ua

## 2022-08-02 LAB — GC/CHLAMYDIA PROBE AMP (~~LOC~~) NOT AT ARMC
Chlamydia: NEGATIVE
Comment: NEGATIVE
Comment: NORMAL
Neisseria Gonorrhea: NEGATIVE

## 2022-08-19 ENCOUNTER — Inpatient Hospital Stay (HOSPITAL_COMMUNITY)
Admission: AD | Admit: 2022-08-19 | Discharge: 2022-08-19 | Disposition: A | Discharge status: Home / Self Care | Payer: PRIVATE HEALTH INSURANCE | Attending: Obstetrics and Gynecology | Admitting: Obstetrics and Gynecology

## 2022-08-19 ENCOUNTER — Encounter (HOSPITAL_COMMUNITY): Payer: Self-pay | Admitting: Obstetrics and Gynecology

## 2022-08-19 DIAGNOSIS — M25511 Pain in right shoulder: Secondary | ICD-10-CM

## 2022-08-19 DIAGNOSIS — G8929 Other chronic pain: Secondary | ICD-10-CM | POA: Diagnosis not present

## 2022-08-19 DIAGNOSIS — Z3A09 9 weeks gestation of pregnancy: Secondary | ICD-10-CM

## 2022-08-19 DIAGNOSIS — O26891 Other specified pregnancy related conditions, first trimester: Secondary | ICD-10-CM | POA: Insufficient documentation

## 2022-08-19 DIAGNOSIS — R109 Unspecified abdominal pain: Secondary | ICD-10-CM | POA: Diagnosis not present

## 2022-08-19 LAB — URINALYSIS, ROUTINE W REFLEX MICROSCOPIC
Bilirubin Urine: NEGATIVE
Glucose, UA: NEGATIVE mg/dL
Hgb urine dipstick: NEGATIVE
Ketones, ur: NEGATIVE mg/dL
Leukocytes,Ua: NEGATIVE
Nitrite: NEGATIVE
Protein, ur: NEGATIVE mg/dL
Specific Gravity, Urine: 1.019 (ref 1.005–1.030)
pH: 5 (ref 5.0–8.0)

## 2022-08-19 MED ORDER — KETOROLAC TROMETHAMINE 30 MG/ML IJ SOLN
30.0000 mg | Freq: Once | INTRAMUSCULAR | Status: AC
  Administered 2022-08-19: 30 mg via INTRAMUSCULAR
  Filled 2022-08-19: qty 1

## 2022-08-19 MED ORDER — CYCLOBENZAPRINE HCL 5 MG PO TABS
10.0000 mg | ORAL_TABLET | Freq: Once | ORAL | Status: AC
  Administered 2022-08-19: 10 mg via ORAL
  Filled 2022-08-19: qty 2

## 2022-08-19 NOTE — Discharge Instructions (Signed)
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
## 2022-08-19 NOTE — MAU Note (Addendum)
Latasha Powell is a 32 y.o. at [redacted]w[redacted]d here in MAU reporting: since Monday night, having pain on upper back, shoulder on rt side.  Unable to lift arm, like a major contractions (in shoulder). Sneezed today, had a really bad pain in her abd, like something was detaching, lower abd, happened twice.  This morning she required assistance to sit up from the bed.  No bleeding.  Was laying down watching tv.  Tried to get up to go the bathroom and that is when it started. (Shoulder pain).  Unsure if is related to stress, has been working 14-16 hours daily.  Took 2 Advil at 0530 this morning, did not help, that is why she decided to come in.   Sitting makes the pain worse  Onset of complaint: Monday night Pain score: 10 Vitals:   08/19/22 0946  BP: 115/74  Pulse: 73  Resp: 16  Temp: 98.4 F (36.9 C)  SpO2: 100%      Lab orders placed from triage:  none

## 2022-08-24 ENCOUNTER — Telehealth: Payer: Self-pay | Admitting: Physical Therapy

## 2022-08-24 NOTE — Telephone Encounter (Signed)
Called patient with interpreter Basilia Jumbo [phone] to inform that we do not accept [employer]. She opted to cancel her apt, I informed her to contact her insurance to see where she can go in network.

## 2022-08-26 ENCOUNTER — Ambulatory Visit: Payer: PRIVATE HEALTH INSURANCE | Admitting: Physical Therapy

## 2022-09-15 ENCOUNTER — Ambulatory Visit: Payer: Commercial Managed Care - HMO

## 2022-11-08 DIAGNOSIS — O0932 Supervision of pregnancy with insufficient antenatal care, second trimester: Secondary | ICD-10-CM | POA: Insufficient documentation

## 2022-11-08 DIAGNOSIS — O34219 Maternal care for unspecified type scar from previous cesarean delivery: Secondary | ICD-10-CM | POA: Insufficient documentation

## 2022-11-08 DIAGNOSIS — Z758 Other problems related to medical facilities and other health care: Secondary | ICD-10-CM | POA: Insufficient documentation

## 2022-11-08 DIAGNOSIS — Z603 Acculturation difficulty: Secondary | ICD-10-CM | POA: Insufficient documentation

## 2022-11-08 DIAGNOSIS — Z98891 History of uterine scar from previous surgery: Secondary | ICD-10-CM | POA: Insufficient documentation

## 2022-11-30 ENCOUNTER — Other Ambulatory Visit (HOSPITAL_COMMUNITY)
Admission: RE | Admit: 2022-11-30 | Discharge: 2022-11-30 | Disposition: A | Discharge status: Home / Self Care | Payer: 59 | Source: Ambulatory Visit | Attending: Family Medicine | Admitting: Family Medicine

## 2022-11-30 ENCOUNTER — Ambulatory Visit (INDEPENDENT_AMBULATORY_CARE_PROVIDER_SITE_OTHER): Payer: PRIVATE HEALTH INSURANCE

## 2022-11-30 ENCOUNTER — Other Ambulatory Visit: Payer: Self-pay

## 2022-11-30 VITALS — BP 116/65 | HR 83 | Wt 150.1 lb

## 2022-11-30 DIAGNOSIS — Z3480 Encounter for supervision of other normal pregnancy, unspecified trimester: Secondary | ICD-10-CM

## 2022-11-30 HISTORY — DX: Encounter for supervision of other normal pregnancy, unspecified trimester: Z34.80

## 2022-11-30 NOTE — Progress Notes (Signed)
New OB Intake  Patient came in person for her New OB Intake.  I discussed the limitations, risks, security and privacy concerns of performing an evaluation and management service by telephone and the availability of in person appointments. I also discussed with the patient that there may be a patient responsible charge related to this service. The patient expressed understanding and agreed to proceed.  I explained I am completing New OB Intake today. We discussed EDD of 03/20/2023 that is based on ultrasound of 08/01/2022. Pt is G4/P2012. I reviewed her allergies, medications, Medical/Surgical/OB history, and appropriate screenings. I informed her of [facility] services. [facility] information placed in AVS. Based on history, this is a low risk pregnancy.  Patient Active Problem List   Diagnosis Date Noted   Supervision of other normal pregnancy, antepartum 11/30/2022   Late prenatal care affecting pregnancy in second trimester 11/08/2022   Language barrier 11/08/2022   History of cesarean delivery, currently pregnant 11/08/2022   Open fracture of right elbow 10/25/2019      Concerns addressed today: -Hx of 2 C-Section, would like a C - Section with this pregnancy.   Delivery Plans Plans to deliver at [facility]. Patient given information for [facility] website for more information [facility].   MyChart/Babyscripts MyChart access verified. I explained pt will have some visits in office and some virtually. Babyscripts instructions given and order placed. Patient verifies receipt of registration text/e-mail. Account successfully created and app downloaded.  Blood Pressure Cuff/Weight Scale Patient is self-pay; explained patient will be given BP cuff at first prenatal appt. Explained after first prenatal appt pt will check weekly and document in Babyscripts. Patient does have weight scale.  Anatomy US Explained first scheduled [place] will be around 19 weeks. Anatomy US  scheduled for 01/05/2023 at 1:45PM. Pt notified to arrive at 1:30PM.  Labs Discussed Natera genetic screening with patient. Would like both Panorama and Horizon drawn at new OB visit. Routine prenatal labs needed.  COVID Vaccine Patient has had COVID vaccine.   Is patient a CenteringPregnancy candidate?  Declined Declined due to Schedule    Is patient a Mom+Baby Combined Care candidate?  Not a candidate    Social Determinants of Health Food Insecurity: Patient denies food insecurity. WIC Referral: Patient is interested in referral to [facility].  Transportation: Patient denies transportation needs. Childcare: Discussed no children allowed at ultrasound appointments. Offered childcare services; patient declines childcare services at this time.  Interested in [place]? If yes, send referral and doula dot phrase.   First visit review I reviewed new OB appt with patient. Explained pt will be seen by Venora Maples MD at first visit; encounter routed to appropriate provider. Explained that patient will be seen by pregnancy navigator following visit with provider.   Vidal Schwalbe, [place] 11/30/2022  10:33 AM

## 2022-12-01 LAB — CBC/D/PLT+RPR+RH+ABO+RUBIGG...
Antibody Screen: NEGATIVE
Basophils Absolute: 0 10*3/uL (ref 0.0–0.2)
Basos: 0 %
EOS (ABSOLUTE): 0 10*3/uL (ref 0.0–0.4)
Eos: 0 %
HCV Ab: NONREACTIVE
HIV Screen 4th Generation wRfx: NONREACTIVE
Hematocrit: 38.4 % (ref 34.0–46.6)
Hemoglobin: 13 g/dL (ref 11.1–15.9)
Hepatitis B Surface Ag: NEGATIVE
Immature Grans (Abs): 0.1 10*3/uL (ref 0.0–0.1)
Immature Granulocytes: 1 %
Lymphocytes Absolute: 1.9 10*3/uL (ref 0.7–3.1)
Lymphs: 22 %
MCH: 29.8 pg (ref 26.6–33.0)
MCHC: 33.9 g/dL (ref 31.5–35.7)
MCV: 88 fL (ref 79–97)
Monocytes Absolute: 0.5 10*3/uL (ref 0.1–0.9)
Monocytes: 6 %
Neutrophils Absolute: 6.1 10*3/uL (ref 1.4–7.0)
Neutrophils: 71 %
Platelets: 329 10*3/uL (ref 150–450)
RBC: 4.36 x10E6/uL (ref 3.77–5.28)
RDW: 12.6 % (ref 11.7–15.4)
RPR Ser Ql: NONREACTIVE
Rh Factor: POSITIVE
Rubella Antibodies, IGG: 1.03 index (ref >0.99)
WBC: 8.7 10*3/uL (ref 3.4–10.8)

## 2022-12-01 LAB — GC/CHLAMYDIA PROBE AMP (~~LOC~~) NOT AT ARMC
Chlamydia: NEGATIVE
Comment: NEGATIVE
Comment: NORMAL
Neisseria Gonorrhea: NEGATIVE

## 2022-12-01 LAB — HEMOGLOBIN A1C
Est. average glucose Bld gHb Est-mCnc: 100 mg/dL
Hgb A1c MFr Bld: 5.1 % (ref 4.8–5.6)

## 2022-12-01 LAB — HCV INTERPRETATION

## 2022-12-02 LAB — URINE CULTURE, OB REFLEX

## 2022-12-02 LAB — CULTURE, OB URINE

## 2022-12-07 ENCOUNTER — Encounter: Payer: Self-pay | Admitting: Family Medicine

## 2022-12-07 ENCOUNTER — Other Ambulatory Visit: Payer: Self-pay

## 2022-12-07 ENCOUNTER — Ambulatory Visit (INDEPENDENT_AMBULATORY_CARE_PROVIDER_SITE_OTHER): Payer: 59 | Admitting: Family Medicine

## 2022-12-07 VITALS — BP 114/73 | HR 91 | Wt 147.4 lb

## 2022-12-07 DIAGNOSIS — O0932 Supervision of pregnancy with insufficient antenatal care, second trimester: Secondary | ICD-10-CM | POA: Diagnosis not present

## 2022-12-07 DIAGNOSIS — O34219 Maternal care for unspecified type scar from previous cesarean delivery: Secondary | ICD-10-CM

## 2022-12-07 DIAGNOSIS — Z758 Other problems related to medical facilities and other health care: Secondary | ICD-10-CM | POA: Diagnosis not present

## 2022-12-07 DIAGNOSIS — Z603 Acculturation difficulty: Secondary | ICD-10-CM | POA: Diagnosis not present

## 2022-12-07 DIAGNOSIS — Z3A25 25 weeks gestation of pregnancy: Secondary | ICD-10-CM

## 2022-12-07 DIAGNOSIS — Z3480 Encounter for supervision of other normal pregnancy, unspecified trimester: Secondary | ICD-10-CM

## 2022-12-07 LAB — HORIZON CUSTOM: REPORT SUMMARY: NEGATIVE

## 2022-12-07 NOTE — Progress Notes (Signed)
Subjective:   Latasha Powell is a 32 y.o. [phone] at [redacted]w[redacted]d by early ultrasound being seen today for her first obstetrical visit.  Her obstetrical history is significant for  history of two prior cesareans . Patient does not intend to breast feed. Pregnancy history fully reviewed.  Patient reports no complaints.  HISTORY: OB History  Gravida Para Term Preterm AB Living  4 2 2  0 1 2  SAB IAB Ectopic Multiple Live Births  1 0 0 0 2    # Outcome Date GA Lbr Len/2nd Weight Sex Delivery Anes PTL Lv  4 Current           3 SAB 01/2022          2 Term 10/30/11 [redacted]w[redacted]d   F CS-LTranv   LIV  1 Term 10/31/06 [redacted]w[redacted]d   F CS-LTranv   LIV     Last pap smear: Lab Results  Component Value Date   DIAGPAP  09/15/2021    - Negative for intraepithelial lesion or malignancy (NILM)   HPVHIGH Negative 09/15/2021    Past Medical History:  Diagnosis Date   Medical history non-contributory    Past Surgical History:  Procedure Laterality Date   APPENDECTOMY     CAPSULAR RELEASE Right 03/20/2020   Procedure: RIGHT ELBOW ARTHROTOMY W/CAPSULAR RELEASE;  Surgeon: Bjorn Pippin, MD;  Location: [facility];  Service: Orthopedics;  Laterality: Right;   CESAREAN SECTION     CESAREAN SECTION     X2   I & D EXTREMITY Right 10/25/2019   Procedure: Right elbow joint incision debridement with excisional debridement of bone and tendon as well as subcutaneous tissue.  Right deep wound incision debridement with excisional debridement of skin ,muscle, and fascia. ;  Surgeon: Bjorn Pippin, MD;  [facility];  Service: Orthopedics;  Laterality: Right;   I & D EXTREMITY Right 10/29/2019   Procedure: IRRIGATION AND DEBRIDEMENT EXTREMITY;  Surgeon: Bjorn Pippin, MD;  Location[facility];  Service: Orthopedics;  Laterality: Right;   TRICEPS TENDON REPAIR Right 10/29/2019   Procedure: TRICEPS TENDON REPAIR;  Surgeon: Bjorn Pippin, MD;  Location[facility];  Service: Orthopedics;  Laterality: Right;    ULNAR NERVE TRANSPOSITION Right 03/20/2020   Procedure: NEUROPLASTY AND TRANSPOSITION ULNAR NERVE AT ELBOW;  Surgeon: Bjorn Pippin, MD;  Location: [facility];  Service: Orthopedics;  Laterality: Right;   Family History  Problem Relation Age of Onset   Healthy Mother    Healthy Father    Social History   Tobacco Use   Smoking status: Former    Types: E-cigarettes   Smokeless tobacco: Never  Building services engineer Use: Former   Substances: Nicotine   Devices: QUIT 1 YEAR AGO (only for a month)  Substance Use Topics   Alcohol use: Not Currently    Comment: SOCIAL   Drug use: Never   No Known Allergies Current Outpatient Medications on File Prior to Visit  Medication Sig Dispense Refill   Prenatal Vit-Fe Fumarate-FA (PREPLUS) 27-1 MG TABS Take 1 tablet by mouth daily. 30 tablet 13   No current facility-administered medications on file prior to visit.     Exam   Vitals:   12/07/22 1034  BP: 114/73  Pulse: 91  Weight: 147 lb 6.4 oz (66.9 kg)   Fetal Heart Rate (bpm): 138  System: General: well-developed, well-nourished female in no acute distress   Skin: normal coloration and turgor,  no rashes   Neurologic: oriented, normal, negative, normal mood   Extremities: normal strength, tone, and muscle mass, ROM of all joints is normal   HEENT PERRLA, extraocular movement intact and sclera clear, anicteric   Neck supple and no masses   Respiratory:  no respiratory distress      Assessment:   Pregnancy: [id number] Patient Active Problem List   Diagnosis Date Noted   Supervision of other normal pregnancy, antepartum 11/30/2022   Late prenatal care affecting pregnancy in second trimester 11/08/2022   Language barrier 11/08/2022   History of cesarean delivery, currently pregnant 11/08/2022   Open fracture of right elbow 10/25/2019     Plan:  1. Supervision of other normal pregnancy, antepartum BP and FHR normal Initial labs reviewed, all normal Continue  prenatal vitamins. Genetic Screening discussed, NIPS: results pending. Ultrasound discussed; fetal anatomic survey: ordered. Upset about wait for [place] (about 4 weeks), discussed that FH is normal and likely no issues but also past point of being able to make decisions about pregnancy continuation. Recommended calling MFM daily to see if they have cancellations.  Problem list reviewed and updated. The nature of [facility] with multiple MDs and other Advanced Practice Providers was explained to patient; also emphasized that residents, students are part of our team.  2. History of cesarean delivery, currently pregnant X2, is from [place], first was primary elective Second was repeat in setting of PROM Desires RCS+BTL, sign papers at next visit  3. Language barrier Spanish  4. Late prenatal care affecting pregnancy in second trimester   Discussed fasting labs for next visit.   Routine obstetric precautions reviewed. Return in 2 weeks (on 12/21/2022) for [facility], ob visit.

## 2022-12-07 NOTE — Patient Instructions (Signed)
Tercer trimestre de embarazo Third Trimester of Pregnancy  El tercer trimestre de embarazo va desde la semana 28 hasta la semana 40. Esto corresponde a los meses 7 a 9. El tercer trimestre es un perodo en el que el beb en gestacin (feto) crece rpidamente. Hacia el final del noveno mes, el feto mide alrededor de 20pulgadas (45cm) de largo y pesa entre 6 y 10 libras (2.7 y 4.5kg). Cambios en el cuerpo durante el tercer trimestre Durante el tercer trimestre, su cuerpo contina experimentando numerosos cambios. Los cambios varan y generalmente vuelven a la normalidad despus del nacimiento del beb. Cambios fsicos Seguir aumentando de peso. Es de esperar que aumente entre 25 y 35libras (11 y 16kg) hacia el final del embarazo si inicia el embarazo con un peso normal. Si tiene bajo peso, es de esperar que aumente entre 28 y 40 libras (13 y18 kg), y si tiene sobrepeso, es de esperar que aumente entre 15 y 25 libras (7 y 11kg). Podrn aparecer las primeras estras en las caderas, el abdomen y las mamas. Las mamas seguirn creciendo y pueden doler. Un lquido amarillo (calostro) puede salir de sus pechos. Esta es la primera leche que usted produce para su beb. Tal vez haya cambios en el cabello. Esto cambios pueden incluir su engrosamiento, crecimiento rpido y cambios en la textura. A algunas personas tambin se les cae el cabello durante o despus del embarazo, o tienen el cabello seco o fino. El ombligo puede salir hacia afuera. Puede observar que se le hinchan las manos, el rostro o los tobillos. Cambios en la salud Es posible que tenga acidez estomacal. Puede sufrir estreimiento. Puede desarrollar hemorroides. Puede desarrollar venas hinchadas y abultadas (venas varicosas) en las piernas. Puede presentar ms dolor en la pelvis, la espalda o los muslos. Esto se debe al aumento de peso y al aumento de las hormonas que relajan las articulaciones. Puede presentar un aumento del hormigueo o  entumecimiento en las manos, brazos y piernas. La piel de su abdomen tambin puede sentirse entumecida. Puede sentir que le falta el aire debido a que se expande el tero. Otros cambios Puede tener necesidad de orinar con ms frecuencia porque el feto baja hacia la pelvis y ejerce presin sobre la vejiga. Puede tener ms problemas para dormir. Esto puede deberse al tamao de su abdomen, una mayor necesidad de orinar y un aumento en el metabolismo de su cuerpo. Puede notar que el feto "baja" o lo siente ms bajo, en el abdomen (aligeramiento). Puede tener un aumento de la secrecin vaginal. Puede notar que tiene dolor alrededor del hueso plvico a medida que el tero se distiende. Siga estas instrucciones en su casa: Medicamentos Siga las instrucciones del mdico en relacin con el uso de medicamentos. Durante el embarazo, hay medicamentos que pueden tomarse y otros que no. No tome ningn medicamento a menos que lo haya autorizado el mdico. Tome vitaminas prenatales que contengan por lo menos 600microgramos (mcg) de cido flico. Comida y bebida Lleve una dieta saludable que incluya frutas y verduras frescas, cereales integrales, buenas fuentes de protenas como carnes magras, huevos o tofu, y productos lcteos descremados. Evite la carne cruda y el jugo, la leche y el queso sin pasteurizar. Estos portan grmenes que pueden provocar dao tanto a usted como al beb. Tome 4 o 5 comidas pequeas en lugar de 3 comidas abundantes al da. Es posible que tenga que tomar estas medidas para prevenir o tratar el estreimiento: Beber suficiente lquido como para mantener la   orina de color amarillo plido. Consumir alimentos ricos en fibra, como frijoles, cereales integrales, y frutas y verduras frescas. Limitar el consumo de alimentos ricos en grasa y azcares procesados, como los alimentos fritos o dulces. Actividad Haga ejercicio solamente como se lo haya indicado el mdico. La mayora de las personas  pueden continuar su actividad fsica habitual durante el embarazo. Intente realizar como mnimo 30minutos de actividad fsica por lo menos 5das a la semana. Deje de hacer ejercicio si experimenta contracciones en el tero. Deje de hacer ejercicio si le aparecen dolor o clicos en la parte baja del vientre o de la espalda. Evite levantar pesos excesivos. No haga ejercicio si hace mucho calor o humedad, o si se encuentra a una altitud elevada. Si lo desea, puede seguir teniendo relaciones sexuales, salvo que el mdico le indique lo contrario. Alivio del dolor y del malestar Haga pausas frecuentes y descanse con las piernas levantadas (elevadas) si tiene calambres en las piernas o dolor en la parte baja de la espalda. Dese baos de asiento con agua tibia para aliviar el dolor o las molestias causadas por las hemorroides. Use una crema para las hemorroides si el mdico la autoriza. Use un sujetador que le brinde buen soporte para prevenir las molestias causadas por la sensibilidad en las mamas. Si tiene venas varicosas: Use medias de compresin como se lo haya indicado el mdico. Eleve los pies durante 15minutos, 3 o 4veces por da. Limite el consumo de sal en su dieta. Seguridad Hable con su mdico antes de viajar distancias largas. No se d baos de inmersin en agua caliente, baos turcos ni saunas. Use el cinturn de seguridad en todo momento mientras conduce o va en auto. Hable con el mdico si es vctima de maltrato verbal o fsico. Preparacin para el nacimiento Para prepararse para la llegada de su beb: Tome clases prenatales para entender, practicar, y hacer preguntas sobre el trabajo de parto y el parto. Visite el hospital y recorra el rea de maternidad. Compre un asiento de seguridad orientado hacia atrs, y asegrese de saber cmo instalarlo en su automvil. Prepare la habitacin o el lugar donde dormir el beb. Asegrese de quitar todas las almohadas y animales de peluche de  la cuna del beb para evitar la asfixia. Indicaciones generales Evite el contacto con las bandejas sanitarias de los gatos y la tierra que estos animales usan. Estos alimentos contienen bacterias que pueden causar defectos congnitos en el beb. Si tiene un gato, pdale a alguien que limpie la caja de arena por usted. No se haga lavados vaginales ni use tampones. No use toallas higinicas perfumadas. No consuma ningn producto que contenga nicotina o tabaco, como cigarrillos, cigarrillos electrnicos y tabaco de mascar. Si necesita ayuda para dejar de consumir estos productos, consulte al mdico. No use ningn remedio a base de hierbas, drogas ilegales o medicamentos que no le hayan sido recetados. Las sustancias qumicas de estos productos pueden daar al beb. No beba alcohol. Le realizarn exmenes prenatales ms frecuentes durante el tercer trimestre. Durante una visita prenatal de rutina, el mdico le har un examen fsico, le realizar pruebas y hablar con usted de su salud general. Cumpla con todas las visitas de seguimiento. Esto es importante. Dnde buscar ms informacin American Pregnancy Association (Asociacin Estadounidense del Embarazo): americanpregnancy.org American College of Obstetricians and Gynecologists (Colegio Estadounidense de Obstetras y Gineclogos): acog.org/womens-health/pregnancy? Office on Women's Health (Oficina para la Salud de la Mujer): womenshealth.gov/pregnancy Comunquese con un mdico si tiene: Fiebre. Clicos leves en   la pelvis, presin en la pelvis o dolor persistente en la zona abdominal o la parte baja de la espalda. Vmitos o diarrea. Secrecin vaginal con mal olor u orina con mal olor. Dolor al orinar. Un dolor de cabeza que no desaparece despus de tomar analgsicos. Cambios en la visin o ve manchas delante de los ojos. Solicite ayuda de inmediato si: Rompe la bolsa. Tiene contracciones regulares separadas por menos de 5minutos. Tiene sangrado o  pequeas prdidas vaginales. Siente un dolor abdominal intenso. Tiene dificultad para respirar. Siente dolor en el pecho. Sufre episodios de desmayo. No ha sentido a su beb moverse durante el perodo de tiempo que le indic el mdico. Tiene dolor, hinchazn o enrojecimiento nuevos en un brazo o una pierna o se produce un aumento de alguno de estos sntomas. Resumen El tercer trimestre del embarazo comprende desde la semana28 hasta la semana 40 (desde el mes7 hasta el mes9). Puede tener ms problemas para dormir. Esto puede deberse al tamao de su abdomen, una mayor necesidad de orinar y un aumento en el metabolismo de su cuerpo. Le realizarn exmenes prenatales ms frecuentes durante el tercer trimestre. Cumpla con todas las visitas de seguimiento. Esto es importante. Esta informacin no tiene como fin reemplazar el consejo del mdico. Asegrese de hacerle al mdico cualquier pregunta que tenga. Document Revised: 01/11/2020 Document Reviewed: 01/11/2020 Elsevier Patient Education  2023 Elsevier Inc.  Eleccin del mtodo anticonceptivo Contraception Choices La anticoncepcin, o los mtodos anticonceptivos, hace referencia a los mtodos o dispositivos que evitan el embarazo. Mtodos hormonales  Implante anticonceptivo Un implante anticonceptivo consiste en un tubo delgado de plstico que contiene una hormona que evita el embarazo. Es diferente de un dispositivo intrauterino (DIU). Un mdico lo inserta en la parte superior del brazo. Los implantes pueden ser eficaces durante un mximo de 3 aos. Inyecciones de progestina sola Las inyecciones de progestina sola contienen progestina, una forma sinttica de la hormona progesterona. Un mdico las administra cada 3 meses. Pldoras anticonceptivas Las pldoras anticonceptivas son pastillas que contienen hormonas que evitan el embarazo. Deben tomarse una vez al da, preferentemente a la misma hora cada da. Se necesita una receta para utilizar  este mtodo anticonceptivo. Parche anticonceptivo El parche anticonceptivo contiene hormonas que evitan el embarazo. Se coloca en la piel, debe cambiarse una vez a la semana durante tres semanas y debe retirarse en la cuarta semana. Se necesita una receta para utilizar este mtodo anticonceptivo. Anillo vaginal Un anillo vaginal contiene hormonas que evitan el embarazo. Se coloca en la vagina durante tres semanas y se retira en la cuarta semana. Luego se repite el proceso con un anillo nuevo. Se necesita una receta para utilizar este mtodo anticonceptivo. Anticonceptivo de emergencia Los anticonceptivos de emergencia son mtodos para evitar un embarazo despus de tener sexo sin proteccin. Vienen en forma de pldora y pueden tomarse hasta 5 das despus de tener sexo. Funcionan mejor cuando se toman lo ms pronto posible luego de tener sexo. La mayora de los anticonceptivos de emergencia estn disponibles sin receta mdica. Este mtodo no debe utilizarse como el nico mtodo anticonceptivo. Mtodos de barrera  Condn masculino Un condn masculino es una vaina delgada que se coloca sobre el pene durante el sexo. Los condones evitan que el esperma ingrese en el cuerpo de la mujer. Pueden utilizarse con un una sustancia que mata a los espermatozoides (espermicida) para aumentar la efectividad. Deben desecharse despus de un uso. Condn femenino Un condn femenino es una vaina blanda y holgada que   se coloca en la vagina antes de tener sexo. El condn evita que el esperma ingrese en el cuerpo de la mujer. Deben desecharse despus de un uso. Diafragma Un diafragma es una barrera blanda con forma de cpula. Se inserta en la vagina antes del sexo, junto con un espermicida. El diafragma bloquea el ingreso de esperma en el tero, y el espermicida mata a los espermatozoides. El diafragma debe permanecer en la vagina durante 6 a 8 horas despus de tener sexo y debe retirarse en el plazo de las 24 horas. Un  diafragma es recetado y colocado por un mdico. Debe reemplazarse cada 1 a 2 aos, despus de dar a luz, de aumentar ms de 15lb (6.8kg) y de una ciruga plvica. Capuchn cervical Un capuchn cervical es una copa redonda y blanda de ltex o plstico que se coloca en el cuello uterino. Se inserta en la vagina antes del sexo, junto con un espermicida. Bloquea el ingreso del esperma en el tero. El capuchn debe permanecer en el lugar durante 6 a 8 horas despus de tener sexo y debe retirarse en el plazo de las 48 horas. Un capuchn cervical debe ser recetado y colocado por un mdico. Debe reemplazarse cada 2aos. Esponja Una esponja es una pieza blanda y circular de espuma de poliuretano que contiene espermicida. La esponja ayuda a bloquear el ingreso de esperma en el tero, y el espermicida mata a los espermatozoides. Para utilizarla, debe humedecerla e insertarla en la vagina. Debe insertarse antes de tener sexo, debe permanecer dentro al menos durante 6 horas despus de tener sexo y debe retirarse y desecharse en el plazo de las 30 horas. Espermicidas Los espermicidas son sustancias qumicas que matan o bloquean al esperma y no lo dejan ingresar al cuello uterino y al tero. Vienen en forma de crema, gel, supositorio, espuma o comprimido. Un espermicida debe insertarse en la vagina con un aplicador al menos 10 o 15 minutos antes de tener sexo para dar tiempo a que surta efecto. El proceso debe repetirse cada vez que tenga sexo. Los espermicidas no requieren receta mdica. Anticonceptivos intrauterinos Dispositivo intrauterino (DIU) Un DIU es un dispositivo en forma de T que se coloca en el tero. Existen dos tipos: DIU hormonal.Este tipo contiene progestina, una forma sinttica de la hormona progesterona. Este tipo puede permanecer colocado durante 3 a 5 aos. DIU de cobre.Este tipo est recubierto con un alambre de cobre. Puede permanecer colocado durante 10 aos. Mtodos anticonceptivos  permanentes Ligadura de trompas en la mujer En este mtodo, se sellan, atan u obstruyen las trompas de Falopio durante una ciruga para evitar que el vulo descienda hacia el tero. Esterilizacin histeroscpica En este mtodo, se coloca un implante pequeo y flexible dentro de cada trompa de Falopio. Los implantes hacen que se forme un tejido cicatricial en las trompas de Falopio y que las obstruya para que el espermatozoide no pueda llegar al vulo. El procedimiento demora alrededor de 3 meses para que sea efectivo. Debe utilizarse otro mtodo anticonceptivo durante esos 3 meses. Esterilizacin masculina Este es un procedimiento que consiste en atar los conductos que transportan el esperma (vasectoma). Luego del procedimiento, el hombre puede eyacular lquido (semen). Debe utilizarse otro mtodo anticonceptivo durante 3 meses despus del procedimiento. Mtodos de planificacin natural Planificacin familiar natural En este mtodo, la pareja no tiene sexo durante los das en que la mujer podra quedar embarazada. Mtodo calendario En este mtodo, la mujer realiza un seguimiento de la duracin de cada ciclo menstrual, identifica   los das en los que se puede producir un embarazo y no tiene sexo durante esos das. Mtodo de la ovulacin En este mtodo, la pareja evita tener sexo durante la ovulacin. Mtodo sintotrmico Este mtodo implica no tener sexo durante la ovulacin. Normalmente, la mujer comprueba la ovulacin al observar cambios en su temperatura y en la consistencia del moco cervical. Mtodo posovulacin En este mtodo, la pareja espera a que finalice la ovulacin para tener sexo. Dnde buscar ms informacin Centers for Disease Control and Prevention (Centros para el Control y la Prevencin de Enfermedades): www.cdc.gov Resumen La anticoncepcin, o los mtodos anticonceptivos, hace referencia a los mtodos o dispositivos que evitan el embarazo. Los mtodos anticonceptivos hormonales  incluyen implantes, inyecciones, pastillas, parches, anillos vaginales y anticonceptivos de emergencia. Los mtodos anticonceptivos de barrera pueden incluir condones masculinos, condones femeninos, diafragmas, capuchones cervicales, esponjas y espermicidas. Existen dos tipos de DIU (dispositivo intrauterino). Un DIU puede colocarse en el tero de una mujer para evitar el embarazo durante 3 a 5 aos. La esterilizacin permanente puede realizarse mediante un procedimiento tanto en los hombres como en las mujeres. Los mtodos de planificacin familiar natural implican no tener sexo durante los das en que la mujer podra quedar embarazada. Esta informacin no tiene como fin reemplazar el consejo del mdico. Asegrese de hacerle al mdico cualquier pregunta que tenga. Document Revised: 02/05/2020 Document Reviewed: 02/05/2020 Elsevier Patient Education  2023 Elsevier Inc.  

## 2022-12-08 ENCOUNTER — Telehealth: Payer: Self-pay

## 2022-12-08 LAB — PANORAMA PRENATAL TEST FULL PANEL:PANORAMA TEST PLUS 5 ADDITIONAL MICRODELETIONS: FETAL FRACTION: 8.4

## 2022-12-08 NOTE — Telephone Encounter (Signed)
Call placed to pt with interpreter Eda. No answer. Left VM with normal results.  Judeth Cornfield, RNC

## 2022-12-08 NOTE — Telephone Encounter (Signed)
-----   Message from Venora Maples, MD sent at 12/08/2022  8:43 AM EDT ----- Normal Panorama and Horizon PL updated Please let patient know as she was very anxious

## 2022-12-24 ENCOUNTER — Ambulatory Visit (INDEPENDENT_AMBULATORY_CARE_PROVIDER_SITE_OTHER): Payer: Self-pay | Admitting: Obstetrics and Gynecology

## 2022-12-24 ENCOUNTER — Other Ambulatory Visit: Payer: 59

## 2022-12-24 ENCOUNTER — Other Ambulatory Visit: Payer: Self-pay

## 2022-12-24 DIAGNOSIS — R12 Heartburn: Secondary | ICD-10-CM

## 2022-12-24 DIAGNOSIS — O34219 Maternal care for unspecified type scar from previous cesarean delivery: Secondary | ICD-10-CM

## 2022-12-24 DIAGNOSIS — R519 Headache, unspecified: Secondary | ICD-10-CM

## 2022-12-24 DIAGNOSIS — Z3480 Encounter for supervision of other normal pregnancy, unspecified trimester: Secondary | ICD-10-CM

## 2022-12-24 DIAGNOSIS — Z3A27 27 weeks gestation of pregnancy: Secondary | ICD-10-CM

## 2022-12-24 DIAGNOSIS — Z3A29 29 weeks gestation of pregnancy: Secondary | ICD-10-CM

## 2022-12-24 DIAGNOSIS — O26893 Other specified pregnancy related conditions, third trimester: Secondary | ICD-10-CM

## 2022-12-24 LAB — CBC
Hematocrit: 35.4 % (ref 34.0–46.6)
Hemoglobin: 12 g/dL (ref 11.1–15.9)
MCH: 29.6 pg (ref 26.6–33.0)
MCHC: 33.9 g/dL (ref 31.5–35.7)
MCV: 87 fL (ref 79–97)
Platelets: 289 10*3/uL (ref 150–450)
RBC: 4.06 x10E6/uL (ref 3.77–5.28)
RDW: 13.4 % (ref 11.7–15.4)
WBC: 8.7 10*3/uL (ref 3.4–10.8)

## 2022-12-24 MED ORDER — FAMOTIDINE 20 MG PO TABS: 20.0000 mg | ORAL_TABLET | Freq: Every day | ORAL | 0 refills | Status: DC

## 2022-12-24 NOTE — Progress Notes (Signed)
Pt reports lower abdominal Pain. Also very bad headaches, right eye was kind of swollen. Pt also is having heartburn a lot.

## 2022-12-24 NOTE — Progress Notes (Signed)
   PRENATAL VISIT NOTE  Subjective:  Latasha Powell is a 32 y.o. [phone] at [redacted]w[redacted]d being seen today for ongoing prenatal care.  She is currently monitored for the following issues for this high-risk pregnancy and has Open fracture of right elbow; Late prenatal care affecting pregnancy in second trimester; Language barrier; History of cesarean delivery, currently pregnant; and Supervision of other normal pregnancy, antepartum on their problem list.  Patient reports headache, the other day she had a bad headache with visual disturbance and took two advils. She also reports having heartburn and has not tried anything for it, she was unsure what is safe.   Contractions: Not present. Vag. Bleeding: None.  Movement: Present. Denies leaking of fluid.   The following portions of the patient's history were reviewed and updated as appropriate: allergies, current medications, past family history, past medical history, past social history, past surgical history and problem list.   Objective:   Vitals:   12/24/22 0915  BP: 131/76  Pulse: 85  Weight: 151 lb 9.6 oz (68.8 kg)    Fetal Status: Fetal Heart Rate (bpm): 130 Fundal Height: 27 cm Movement: Present     General:  Alert, oriented and cooperative. Patient is in no acute distress.  Skin: Skin is warm and dry. No rash noted.   Cardiovascular: Normal heart rate noted  Respiratory: Normal respiratory effort, no problems with respiration noted  Abdomen: Soft, gravid, appropriate for gestational age.  Pain/Pressure: Present     Pelvic: Cervical exam deferred        Extremities: Normal range of motion.  Edema: None  Mental Status: Normal mood and affect. Normal behavior. Normal judgment and thought content.   Assessment and Plan:  Pregnancy: [id number] at [redacted]w[redacted]d  1. Supervision of other normal pregnancy, antepartum BP on the higher side of normal FHR normal FH appropriate Feeling vigorous movement U/s scheduled 6/19  - HIV Antibody  (routine testing w rflx) - RPR - Comp Met (CMET) - Protein / creatinine ratio, urine  2. History of cesarean delivery, currently pregnant C/s x 2 from [place], desires repeat with BTL Will discuss next visit   3. Heart burn Safe options provided, would like rx pepcid - famotidine (PEPCID) 20 MG tablet; Take 1 tablet (20 mg total) by mouth at bedtime.  Dispense: 30 tablet; Refill: 0  4. Pregnancy headache in third trimester Tried advil at home, encouraged to switch to tylenol or exedrine tension Baseline labs today  Strict precautions provided when to seek follow up   - Comp Met (CMET) - Protein / creatinine ratio, urine  Preterm labor symptoms and general obstetric precautions including but not limited to vaginal bleeding, contractions, leaking of fluid and fetal movement were reviewed in detail with the patient. Please refer to After Visit Summary for other counseling recommendations.     Future Appointments  Date Time Provider Department Center  01/05/2023  1:30 PM [facility] NURSE [facility]  01/05/2023  1:45 PM [facility]  01/13/2023  2:35 PM [place], MD [facility]    Albertine Grates, FNP

## 2022-12-25 LAB — GLUCOSE TOLERANCE, 2 HOURS W/ 1HR
Glucose, 1 hour: 171 mg/dL (ref 70–179)
Glucose, 2 hour: 93 mg/dL (ref 70–152)
Glucose, Fasting: 74 mg/dL (ref 70–91)

## 2022-12-26 LAB — PROTEIN / CREATININE RATIO, URINE
Creatinine, Urine: 48.5 mg/dL
Protein, Ur: 5.6 mg/dL
Protein/Creat Ratio: 115 mg/g{creat} (ref 0–200)

## 2022-12-26 LAB — COMPREHENSIVE METABOLIC PANEL
ALT: 20 IU/L (ref 0–32)
AST: 20 IU/L (ref 0–40)
Albumin/Globulin Ratio: 1.6 (ref 1.2–2.2)
Albumin: 3.7 g/dL — ABNORMAL LOW (ref 3.9–4.9)
Alkaline Phosphatase: 106 IU/L (ref 44–121)
BUN/Creatinine Ratio: 8 — ABNORMAL LOW (ref 9–23)
BUN: 4 mg/dL — ABNORMAL LOW (ref 6–20)
Bilirubin Total: <0.2 mg/dL (ref 0.0–1.2)
CO2: 19 mmol/L — ABNORMAL LOW (ref 20–29)
Calcium: 8.6 mg/dL — ABNORMAL LOW (ref 8.7–10.2)
Chloride: 105 mmol/L (ref 96–106)
Creatinine, Ser: 0.48 mg/dL — ABNORMAL LOW (ref 0.57–1.00)
Globulin, Total: 2.3 g/dL (ref 1.5–4.5)
Glucose: 94 mg/dL (ref 70–99)
Potassium: 3.6 mmol/L (ref 3.5–5.2)
Sodium: 139 mmol/L (ref 134–144)
Total Protein: 6 g/dL (ref 6.0–8.5)
eGFR: 128 mL/min/{1.73_m2} (ref >59)

## 2022-12-26 LAB — RPR: RPR Ser Ql: NONREACTIVE

## 2022-12-26 LAB — HIV ANTIBODY (ROUTINE TESTING W REFLEX): HIV Screen 4th Generation wRfx: NONREACTIVE

## 2022-12-31 ENCOUNTER — Encounter: Payer: Self-pay | Admitting: *Deleted

## 2023-01-05 ENCOUNTER — Encounter: Payer: Self-pay | Admitting: Family Medicine

## 2023-01-05 ENCOUNTER — Ambulatory Visit: Payer: 59 | Admitting: *Deleted

## 2023-01-05 ENCOUNTER — Ambulatory Visit: Payer: 59 | Attending: Family Medicine

## 2023-01-05 ENCOUNTER — Encounter: Payer: Self-pay | Admitting: *Deleted

## 2023-01-05 VITALS — BP 115/70 | HR 93

## 2023-01-05 DIAGNOSIS — O0933 Supervision of pregnancy with insufficient antenatal care, third trimester: Secondary | ICD-10-CM | POA: Diagnosis present

## 2023-01-05 DIAGNOSIS — Z3A29 29 weeks gestation of pregnancy: Secondary | ICD-10-CM | POA: Diagnosis not present

## 2023-01-05 DIAGNOSIS — Z3480 Encounter for supervision of other normal pregnancy, unspecified trimester: Secondary | ICD-10-CM

## 2023-01-05 DIAGNOSIS — O365990 Maternal care for other known or suspected poor fetal growth, unspecified trimester, not applicable or unspecified: Secondary | ICD-10-CM | POA: Insufficient documentation

## 2023-01-06 ENCOUNTER — Other Ambulatory Visit: Payer: Self-pay | Admitting: *Deleted

## 2023-01-06 DIAGNOSIS — O0933 Supervision of pregnancy with insufficient antenatal care, third trimester: Secondary | ICD-10-CM

## 2023-01-06 DIAGNOSIS — O34219 Maternal care for unspecified type scar from previous cesarean delivery: Secondary | ICD-10-CM

## 2023-01-11 ENCOUNTER — Ambulatory Visit: Payer: 59 | Admitting: *Deleted

## 2023-01-11 ENCOUNTER — Encounter: Payer: Self-pay | Admitting: *Deleted

## 2023-01-11 ENCOUNTER — Ambulatory Visit: Payer: 59 | Attending: Obstetrics

## 2023-01-11 VITALS — BP 115/65 | HR 89

## 2023-01-11 DIAGNOSIS — Z3A30 30 weeks gestation of pregnancy: Secondary | ICD-10-CM | POA: Diagnosis not present

## 2023-01-11 DIAGNOSIS — Z3480 Encounter for supervision of other normal pregnancy, unspecified trimester: Secondary | ICD-10-CM | POA: Insufficient documentation

## 2023-01-11 DIAGNOSIS — O34219 Maternal care for unspecified type scar from previous cesarean delivery: Secondary | ICD-10-CM

## 2023-01-11 DIAGNOSIS — O0933 Supervision of pregnancy with insufficient antenatal care, third trimester: Secondary | ICD-10-CM

## 2023-01-11 DIAGNOSIS — O365930 Maternal care for other known or suspected poor fetal growth, third trimester, not applicable or unspecified: Secondary | ICD-10-CM | POA: Diagnosis present

## 2023-01-11 NOTE — Procedures (Signed)
Latasha Powell 1990/11/17 [redacted]w[redacted]d  Fetus A Non-Stress Test Interpretation for 01/11/23-- [facility] ONLY  Indication: IUGR  Fetal Heart Rate A Mode: External Baseline Rate (A): 130 bpm Variability: Moderate Accelerations: 10 x 10 Multiple birth?: No  Uterine Activity Mode: Toco Contraction Frequency (min): none Resting Tone Palpated: Relaxed Resting Time: Adequate  Interpretation (Fetal Testing) Nonstress Test Interpretation: Reactive Overall Impression: Reassuring for gestational age Comments: Tracing reviewed by Dr. Darra Lis

## 2023-01-13 ENCOUNTER — Ambulatory Visit (INDEPENDENT_AMBULATORY_CARE_PROVIDER_SITE_OTHER): Payer: 59 | Admitting: Obstetrics and Gynecology

## 2023-01-13 ENCOUNTER — Other Ambulatory Visit: Payer: Self-pay | Admitting: *Deleted

## 2023-01-13 ENCOUNTER — Other Ambulatory Visit: Payer: Self-pay

## 2023-01-13 VITALS — BP 114/72 | HR 85 | Wt 156.1 lb

## 2023-01-13 DIAGNOSIS — O26893 Other specified pregnancy related conditions, third trimester: Secondary | ICD-10-CM | POA: Insufficient documentation

## 2023-01-13 DIAGNOSIS — Z603 Acculturation difficulty: Secondary | ICD-10-CM

## 2023-01-13 DIAGNOSIS — Z23 Encounter for immunization: Secondary | ICD-10-CM

## 2023-01-13 DIAGNOSIS — O365930 Maternal care for other known or suspected poor fetal growth, third trimester, not applicable or unspecified: Secondary | ICD-10-CM

## 2023-01-13 DIAGNOSIS — R519 Headache, unspecified: Secondary | ICD-10-CM | POA: Insufficient documentation

## 2023-01-13 DIAGNOSIS — Z3A30 30 weeks gestation of pregnancy: Secondary | ICD-10-CM

## 2023-01-13 DIAGNOSIS — Z3480 Encounter for supervision of other normal pregnancy, unspecified trimester: Secondary | ICD-10-CM

## 2023-01-13 DIAGNOSIS — O34219 Maternal care for unspecified type scar from previous cesarean delivery: Secondary | ICD-10-CM

## 2023-01-13 DIAGNOSIS — Z758 Other problems related to medical facilities and other health care: Secondary | ICD-10-CM

## 2023-01-13 HISTORY — DX: Other specified pregnancy related conditions, third trimester: O26.893

## 2023-01-13 HISTORY — DX: Headache, unspecified: R51.9

## 2023-01-13 MED ORDER — MAGNESIUM OXIDE 400 240 MG PO PACK: 1.0000 | PACK | Freq: Every day | ORAL | 0 refills | Status: DC

## 2023-01-13 MED ORDER — CALCIUM CARBONATE ANTACID 500 MG PO CHEW: 1.0000 | CHEWABLE_TABLET | Freq: Two times a day (BID) | ORAL | 1 refills | Status: DC | PRN

## 2023-01-13 NOTE — Progress Notes (Signed)
   PRENATAL VISIT NOTE  Subjective:  Latasha Powell is a 32 y.o. [phone] at [redacted]w[redacted]d being seen today for ongoing prenatal care.  She is currently monitored for the following issues for this high-risk pregnancy and has Late prenatal care affecting pregnancy in second trimester; Language barrier; History of cesarean delivery, currently pregnant; Supervision of other normal pregnancy, antepartum; IUGR (intrauterine growth restriction) affecting care of mother; and Pregnancy headache in third trimester on their problem list.  Patient reports  headache on left side; she denies any HAs pre pregnancy .  Contractions: Irritability. Vag. Bleeding: None.  Movement: Present. Denies leaking of fluid.   The following portions of the patient's history were reviewed and updated as appropriate: allergies, current medications, past family history, past medical history, past social history, past surgical history and problem list.   Objective:   Vitals:   01/13/23 1446  BP: 114/72  Pulse: 85  Weight: 156 lb 1.6 oz (70.8 kg)    Fetal Status: Fetal Heart Rate (bpm): 134   Movement: Present     General:  Alert, oriented and cooperative. Patient is in no acute distress.  Skin: Skin is warm and dry. No rash noted.   Cardiovascular: Normal heart rate noted  Respiratory: Normal respiratory effort, no problems with respiration noted  Abdomen: Soft, gravid, appropriate for gestational age.  Pain/Pressure: Present     Pelvic: Cervical exam deferred        Extremities: Normal range of motion.     Mental Status: Normal mood and affect. Normal behavior. Normal judgment and thought content.  Neuro: CN 2-12 grossly intact, normal gait, eomi, perrl, 5/5 strength and normal sensation diffusely, 3+ patellar and 2+ brachial Assessment and Plan:  Pregnancy: [id number] at [redacted]w[redacted]d 1. Supervision of other normal pregnancy, antepartum Routine care. Not interested in BTL. D/w pt re: other options at future visits - Tdap  vaccine greater than or equal to 7yo IM  2. Poor fetal growth affecting management of mother in third trimester, single or unspecified fetus Continue with qwk testing 6/25: ua dopplers wnl, afi 15 6/19: efw 8%, 1190gm, ac 19%, ua dopplers wnl, afi 17.6  3. History of cesarean delivery, currently pregnant Delivery planning pending subsequent mfm scans  4. Pregnancy headache in third trimester Gets about 6 hours per night of sleep and has to get up at times to use the bathroom, pt not snacking during the day and she works outside helping the workers. Neuro exam wnl and recommend hydration, snacking and getting more sleep at night. Mg sent in as well as tums to help with heartburn.   Preterm labor symptoms and general obstetric precautions including but not limited to vaginal bleeding, contractions, leaking of fluid and fetal movement were reviewed in detail with the patient. Please refer to After Visit Summary for other counseling recommendations.   No follow-ups on file.  Future Appointments  Date Time Provider Department Center  01/19/2023  2:00 PM [facility] NURSE [facility]  01/19/2023  2:15 PM [facility] N[facility]  01/19/2023  3:45 PM [facility] US6 [facility]  01/25/2023  9:45 AM [facility] NURSE [facility]  01/25/2023 10:00 AM [facility] US1 [facility]  02/08/2023 10:15 AM Milas Hock, MD [facility]  02/24/2023  1:15 PM Adam Phenix, MD [facility]    Russell Bing, MD

## 2023-01-13 NOTE — Progress Notes (Signed)
Pt reports having a lot more Migraine headaches lately, meds are not helping.

## 2023-01-13 NOTE — Progress Notes (Signed)
FGR

## 2023-01-13 NOTE — Progress Notes (Signed)
Breast Pump order faxed to [facility].

## 2023-01-19 ENCOUNTER — Ambulatory Visit (HOSPITAL_BASED_OUTPATIENT_CLINIC_OR_DEPARTMENT_OTHER): Payer: 59

## 2023-01-19 ENCOUNTER — Ambulatory Visit: Payer: 59 | Admitting: *Deleted

## 2023-01-19 ENCOUNTER — Ambulatory Visit: Payer: 59 | Attending: Obstetrics | Admitting: *Deleted

## 2023-01-19 VITALS — BP 131/69 | HR 99

## 2023-01-19 DIAGNOSIS — Z3A31 31 weeks gestation of pregnancy: Secondary | ICD-10-CM | POA: Diagnosis not present

## 2023-01-19 DIAGNOSIS — O0933 Supervision of pregnancy with insufficient antenatal care, third trimester: Secondary | ICD-10-CM

## 2023-01-19 DIAGNOSIS — O34219 Maternal care for unspecified type scar from previous cesarean delivery: Secondary | ICD-10-CM

## 2023-01-19 DIAGNOSIS — O365930 Maternal care for other known or suspected poor fetal growth, third trimester, not applicable or unspecified: Secondary | ICD-10-CM

## 2023-01-19 DIAGNOSIS — Z362 Encounter for other antenatal screening follow-up: Secondary | ICD-10-CM | POA: Insufficient documentation

## 2023-01-19 NOTE — Procedures (Signed)
Latasha Powell June 17, 1991 [redacted]w[redacted]d  Fetus A Non-Stress Test Interpretation for 01/19/23  Indication: IUGR  Fetal Heart Rate A Mode: External Baseline Rate (A): 135 bpm Variability: Moderate Accelerations: 10 x 10 Decelerations: Variable Multiple birth?: No  Uterine Activity Mode: Toco Contraction Frequency (min): none Resting Tone Palpated: Relaxed  Interpretation (Fetal Testing) Nonstress Test Interpretation: Reactive Comments: Tracing reviewed byDr. Parke Poisson

## 2023-01-21 ENCOUNTER — Other Ambulatory Visit: Payer: Self-pay | Admitting: *Deleted

## 2023-01-21 DIAGNOSIS — O365930 Maternal care for other known or suspected poor fetal growth, third trimester, not applicable or unspecified: Secondary | ICD-10-CM

## 2023-01-25 ENCOUNTER — Ambulatory Visit: Payer: 59 | Admitting: *Deleted

## 2023-01-25 ENCOUNTER — Other Ambulatory Visit: Payer: Self-pay | Admitting: Obstetrics

## 2023-01-25 ENCOUNTER — Ambulatory Visit: Payer: 59 | Attending: Obstetrics

## 2023-01-25 VITALS — BP 114/70 | HR 92

## 2023-01-25 DIAGNOSIS — O365930 Maternal care for other known or suspected poor fetal growth, third trimester, not applicable or unspecified: Secondary | ICD-10-CM | POA: Insufficient documentation

## 2023-01-25 DIAGNOSIS — O34219 Maternal care for unspecified type scar from previous cesarean delivery: Secondary | ICD-10-CM | POA: Insufficient documentation

## 2023-01-25 DIAGNOSIS — Z3480 Encounter for supervision of other normal pregnancy, unspecified trimester: Secondary | ICD-10-CM | POA: Insufficient documentation

## 2023-01-25 DIAGNOSIS — Z3A32 32 weeks gestation of pregnancy: Secondary | ICD-10-CM | POA: Diagnosis not present

## 2023-01-25 DIAGNOSIS — O0933 Supervision of pregnancy with insufficient antenatal care, third trimester: Secondary | ICD-10-CM

## 2023-01-25 DIAGNOSIS — O09293 Supervision of pregnancy with other poor reproductive or obstetric history, third trimester: Secondary | ICD-10-CM | POA: Diagnosis not present

## 2023-02-08 ENCOUNTER — Encounter: Payer: Self-pay | Admitting: Obstetrics and Gynecology

## 2023-02-08 ENCOUNTER — Ambulatory Visit (INDEPENDENT_AMBULATORY_CARE_PROVIDER_SITE_OTHER): Payer: 59 | Admitting: Obstetrics and Gynecology

## 2023-02-08 ENCOUNTER — Other Ambulatory Visit: Payer: Self-pay

## 2023-02-08 VITALS — BP 117/70 | HR 88 | Wt 157.1 lb

## 2023-02-08 DIAGNOSIS — Z3A34 34 weeks gestation of pregnancy: Secondary | ICD-10-CM

## 2023-02-08 DIAGNOSIS — O34219 Maternal care for unspecified type scar from previous cesarean delivery: Secondary | ICD-10-CM

## 2023-02-08 DIAGNOSIS — O365930 Maternal care for other known or suspected poor fetal growth, third trimester, not applicable or unspecified: Secondary | ICD-10-CM

## 2023-02-08 DIAGNOSIS — Z3480 Encounter for supervision of other normal pregnancy, unspecified trimester: Secondary | ICD-10-CM

## 2023-02-08 NOTE — Progress Notes (Signed)
   PRENATAL VISIT NOTE  Subjective:  Latasha Powell is a 32 y.o. [phone] at [redacted]w[redacted]d being seen today for ongoing prenatal care.  She is currently monitored for the following issues for this low-risk pregnancy and has Late prenatal care affecting pregnancy in second trimester; History of cesarean delivery, currently pregnant; Supervision of other normal pregnancy, antepartum; IUGR (intrauterine growth restriction) affecting care of mother; and Pregnancy headache in third trimester on their problem list.  Patient reports  low pressure (dizzy/lightheaded at times) today with headaches. No headache today .  Contractions: Irritability. Vag. Bleeding: None.  Movement: Present. Denies leaking of fluid.   The following portions of the patient's history were reviewed and updated as appropriate: allergies, current medications, past family history, past medical history, past social history, past surgical history and problem list.   Objective:   Vitals:   02/08/23 1044  BP: 117/70  Pulse: 88  Weight: 157 lb 1.6 oz (71.3 kg)    Fetal Status: Fetal Heart Rate (bpm): 130   Movement: Present     General:  Alert, oriented and cooperative. Patient is in no acute distress.  Skin: Skin is warm and dry. No rash noted.   Cardiovascular: Normal heart rate noted  Respiratory: Normal respiratory effort, no problems with respiration noted  Abdomen: Soft, gravid, appropriate for gestational age.  Pain/Pressure: Present     Pelvic: Cervical exam deferred        Extremities: Normal range of motion.  Edema: Trace  Mental Status: Normal mood and affect. Normal behavior. Normal judgment and thought content.   Assessment and Plan:  Pregnancy: [id number] at [redacted]w[redacted]d 1. History of cesarean delivery, currently pregnant Will schedule for 39 weeks (8/26) but if FGR again, would consider moving to 37w.  Declines interpreter.   2. Supervision of other normal pregnancy, antepartum Cultures next visit.   3. Poor  fetal growth affecting management of mother in third trimester, single or unspecified fetus Seems to be resolving - last growth was 10%ile. Next [place] on 7/31.   4. Pregnancy with 34 completed weeks gestation   Preterm labor symptoms and general obstetric precautions including but not limited to vaginal bleeding, contractions, leaking of fluid and fetal movement were reviewed in detail with the patient. Please refer to After Visit Summary for other counseling recommendations.   Return in about 2 weeks (around 02/22/2023) for OB VISIT, MD or APP.  Future Appointments  Date Time Provider Department Center  02/16/2023  7:15 AM [facility] NURSE [facility]  02/16/2023  7:30 AM [facility] US3 [facility]  02/24/2023  1:15 PM Adam Phenix, MD [facility]    Milas Hock, MD

## 2023-02-08 NOTE — Progress Notes (Signed)
Pt reports that her BP has been low at home running 100/50, feeling light heeded with headaches every morning she says.

## 2023-02-09 ENCOUNTER — Ambulatory Visit: Payer: 59

## 2023-02-16 ENCOUNTER — Ambulatory Visit: Payer: 59 | Attending: Obstetrics

## 2023-02-16 ENCOUNTER — Other Ambulatory Visit: Payer: Self-pay | Admitting: *Deleted

## 2023-02-16 ENCOUNTER — Ambulatory Visit: Payer: 59 | Admitting: *Deleted

## 2023-02-16 VITALS — BP 125/78 | HR 78

## 2023-02-16 DIAGNOSIS — Z3A35 35 weeks gestation of pregnancy: Secondary | ICD-10-CM | POA: Diagnosis not present

## 2023-02-16 DIAGNOSIS — O0933 Supervision of pregnancy with insufficient antenatal care, third trimester: Secondary | ICD-10-CM

## 2023-02-16 DIAGNOSIS — O365930 Maternal care for other known or suspected poor fetal growth, third trimester, not applicable or unspecified: Secondary | ICD-10-CM | POA: Diagnosis present

## 2023-02-16 DIAGNOSIS — O34219 Maternal care for unspecified type scar from previous cesarean delivery: Secondary | ICD-10-CM

## 2023-02-24 ENCOUNTER — Other Ambulatory Visit (HOSPITAL_COMMUNITY)
Admission: RE | Admit: 2023-02-24 | Discharge: 2023-02-24 | Disposition: A | Discharge status: Home / Self Care | Payer: 59 | Source: Ambulatory Visit | Attending: Obstetrics & Gynecology | Admitting: Obstetrics & Gynecology

## 2023-02-24 ENCOUNTER — Ambulatory Visit: Payer: 59 | Attending: Maternal & Fetal Medicine

## 2023-02-24 ENCOUNTER — Ambulatory Visit: Payer: 59 | Admitting: *Deleted

## 2023-02-24 ENCOUNTER — Ambulatory Visit (INDEPENDENT_AMBULATORY_CARE_PROVIDER_SITE_OTHER): Payer: 59 | Admitting: Obstetrics & Gynecology

## 2023-02-24 VITALS — BP 127/71 | HR 84

## 2023-02-24 VITALS — BP 122/77 | HR 83 | Wt 161.2 lb

## 2023-02-24 DIAGNOSIS — O0933 Supervision of pregnancy with insufficient antenatal care, third trimester: Secondary | ICD-10-CM | POA: Insufficient documentation

## 2023-02-24 DIAGNOSIS — Z3480 Encounter for supervision of other normal pregnancy, unspecified trimester: Secondary | ICD-10-CM

## 2023-02-24 DIAGNOSIS — O365930 Maternal care for other known or suspected poor fetal growth, third trimester, not applicable or unspecified: Secondary | ICD-10-CM

## 2023-02-24 DIAGNOSIS — Z3A36 36 weeks gestation of pregnancy: Secondary | ICD-10-CM | POA: Insufficient documentation

## 2023-02-24 DIAGNOSIS — O0932 Supervision of pregnancy with insufficient antenatal care, second trimester: Secondary | ICD-10-CM

## 2023-02-24 DIAGNOSIS — O34219 Maternal care for unspecified type scar from previous cesarean delivery: Secondary | ICD-10-CM | POA: Diagnosis present

## 2023-02-24 NOTE — Progress Notes (Signed)
   PRENATAL VISIT NOTE  Subjective:  Latasha Powell is a 32 y.o. [phone] at [redacted]w[redacted]d being seen today for ongoing prenatal care.  She is currently monitored for the following issues for this high-risk pregnancy and has Late prenatal care affecting pregnancy in second trimester; History of cesarean delivery, currently pregnant; Supervision of other normal pregnancy, antepartum; IUGR (intrauterine growth restriction) affecting care of mother; and Pregnancy headache in third trimester on their problem list.  Patient reports no complaints.  Contractions: Irritability. Vag. Bleeding: None.  Movement: Present. Denies leaking of fluid.   The following portions of the patient's history were reviewed and updated as appropriate: allergies, current medications, past family history, past medical history, past social history, past surgical history and problem list.   Objective:   Vitals:   02/24/23 1352  BP: 122/77  Pulse: 83  Weight: 161 lb 3.2 oz (73.1 kg)    Fetal Status: Fetal Heart Rate (bpm): 148   Movement: Present  Presentation: Vertex  General:  Alert, oriented and cooperative. Patient is in no acute distress.  Skin: Skin is warm and dry. No rash noted.   Cardiovascular: Normal heart rate noted  Respiratory: Normal respiratory effort, no problems with respiration noted  Abdomen: Soft, gravid, appropriate for gestational age.  Pain/Pressure: Present     Pelvic: Cervical exam performed in the presence of a chaperone Dilation: Closed Effacement (%): 30 Station: -3  Extremities: Normal range of motion.  Edema: None  Mental Status: Normal mood and affect. Normal behavior. Normal judgment and thought content.   Assessment and Plan:  Pregnancy: [id number] at [redacted]w[redacted]d 1. Supervision of other normal pregnancy, antepartum  - GC/Chlamydia probe amp ([place])not at [facility] - Culture, beta strep (group b only)  2. Late prenatal care affecting pregnancy in second trimester   3. History of  cesarean delivery, currently pregnant Repeat at 39 weeks  4. Poor fetal growth affecting management of mother in third trimester, single or unspecified fetus F/u US today  Preterm labor symptoms and general obstetric precautions including but not limited to vaginal bleeding, contractions, leaking of fluid and fetal movement were reviewed in detail with the patient. Please refer to After Visit Summary for other counseling recommendations.   No follow-ups on file.  Future Appointments  Date Time Provider Department Center  02/24/2023  3:30 PM [facility] NURSE [facility]  02/24/2023  3:45 PM [facility] US1 [facility]  03/02/2023  3:30 PM [facility] NURSE [facility]  03/02/2023  3:45 PM [facility] US1 [facility]  03/09/2023  2:30 PM [facility] NURSE [facility]  03/09/2023  2:45 PM [facility] US6 [facility]    Scheryl Darter, MD

## 2023-02-28 NOTE — Patient Instructions (Addendum)
Latasha Powell  02/28/2023   Your procedure is scheduled on:  03/08/2023  Arrive at 1115 at Entrance C on [employer] [address] at [facility]  and [employer]. You are invited to use the FREE valet parking or use the Visitor's parking deck.  Pick up the phone at the desk and dial [phone].  Call this number if you have problems the morning of surgery: [phone]  Remember:   Do not eat food:(After Midnight) Desps de medianoche.  Do not drink clear liquids: (After Midnight) Desps de medianoche.  Take these medicines the morning of surgery with A SIP OF WATER:  none   Do not wear jewelry, make-up or nail polish.  Do not wear lotions, powders, or perfumes. Do not wear deodorant.  Do not shave 48 hours prior to surgery.  Do not bring valuables to the hospital.  [facility] is not   responsible for any belongings or valuables brought to the hospital.  Contacts, dentures or bridgework may not be worn into surgery.  Leave suitcase in the car. After surgery it may be brought to your room.  For patients admitted to the hospital, checkout time is 11:00 AM the day of              discharge.      Please read over the following fact sheets that you were given:     Preparing for Surgery                                          Instrucciones Para Antes de la Ciruga   Su ciruga est programada para 03/08/2023  (your procedure is scheduled on) Entre por la entrada principal del [facility]  a las [address] de la [place] -(enter through the main entrance at [facility] at 1115 AM    [address] [place], [place] [address] para informarnos de su llegada. (pick up phone, dial [phone] on arrival)     Por favor llame al [phone] si tiene algn problema en la maana de la ciruga (please call this number if you have any problems the morning of surgery.)                  Recuerde: (Remember)  No coma alimentos. (Do not eat food (After Midnight) Desps de medianoche)    No  tome lquidos claros. (Do not drink clear liquids (After Midnight) Desps de medianoche)    No use joyas, maquillaje de ojos, lpiz labial, crema para el cuerpo o esmalte de uas oscuro. (Do not wear jewelry, eye makeup, lipstick, body lotion, or dark fingernail polish). Puede usar desodorante (you may wear deodorant)    No se afeite 48 horas de su ciruga. (Do not shave 48 hours before your surgery)    No traiga objetos de valor al hospital.  [place] no se hace responsable de ninguna pertenencia, ni objetos de valor que haya trado al hospital. (Do not bring valuable to the hospital.  [facility] is not responsible for any belongings or valuables brought to the hospital)   [facility] medicinas en la maana de la ciruga con un SORBITO de agua nada (take these meds the morning of surgery with a SIP of water)     Durante la ciruga no se pueden usar lentes de contacto, dentaduras o puentes. (Contacts, dentures or bridgework cannot be worn in surgery).  Si va a ser ingresado despus de la ciruga, deje la [employer] en el carro hasta que se le haya asignado una habitacin. (If you are to be admitted after surgery, leave suitcase in car until your room has been assigned.)   A los pacientes que se les d de alta el mismo da no se les permitir manejar a casa.  (Patients discharged on the day of surgery will not be allowed to drive home)    [place] y nmero de t[place] del [employer] na. (Name and telephone number of your driver)   Instrucciones especiales Shower using CHG 2 nights before surgery and the night before surgery.  If you shower the day of surgery use CHG.  Use special wash - you have one bottle of CHG for all showers.  You should use approximately 1/3 of the bottle for each shower. (Special Instructions)   Por favor, lea las hojas informativas que le entregaron. (Please read over the following fact sheets that you were given) Surgical Site Infection Prevention

## 2023-03-01 ENCOUNTER — Encounter (HOSPITAL_COMMUNITY): Payer: Self-pay

## 2023-03-02 ENCOUNTER — Ambulatory Visit: Payer: 59 | Admitting: *Deleted

## 2023-03-02 ENCOUNTER — Encounter: Payer: Self-pay | Admitting: Obstetrics and Gynecology

## 2023-03-02 ENCOUNTER — Ambulatory Visit: Payer: 59 | Attending: Maternal & Fetal Medicine

## 2023-03-02 VITALS — BP 131/82 | HR 87

## 2023-03-02 DIAGNOSIS — Z3A37 37 weeks gestation of pregnancy: Secondary | ICD-10-CM | POA: Diagnosis not present

## 2023-03-02 DIAGNOSIS — O34219 Maternal care for unspecified type scar from previous cesarean delivery: Secondary | ICD-10-CM | POA: Diagnosis not present

## 2023-03-02 DIAGNOSIS — O365930 Maternal care for other known or suspected poor fetal growth, third trimester, not applicable or unspecified: Secondary | ICD-10-CM | POA: Diagnosis present

## 2023-03-02 DIAGNOSIS — Z3480 Encounter for supervision of other normal pregnancy, unspecified trimester: Secondary | ICD-10-CM | POA: Insufficient documentation

## 2023-03-02 DIAGNOSIS — O0933 Supervision of pregnancy with insufficient antenatal care, third trimester: Secondary | ICD-10-CM

## 2023-03-03 ENCOUNTER — Other Ambulatory Visit: Payer: Self-pay | Admitting: *Deleted

## 2023-03-03 ENCOUNTER — Encounter: Payer: Self-pay | Admitting: *Deleted

## 2023-03-03 MED ORDER — PROMETHAZINE HCL 25 MG PO TABS: 25.0000 mg | ORAL_TABLET | Freq: Four times a day (QID) | ORAL | 0 refills | Status: DC | PRN

## 2023-03-04 ENCOUNTER — Other Ambulatory Visit: Payer: Self-pay

## 2023-03-04 ENCOUNTER — Ambulatory Visit (INDEPENDENT_AMBULATORY_CARE_PROVIDER_SITE_OTHER): Payer: 59 | Admitting: Obstetrics and Gynecology

## 2023-03-04 VITALS — BP 121/79 | HR 86 | Wt 161.0 lb

## 2023-03-04 DIAGNOSIS — O34219 Maternal care for unspecified type scar from previous cesarean delivery: Secondary | ICD-10-CM

## 2023-03-04 DIAGNOSIS — Z3A37 37 weeks gestation of pregnancy: Secondary | ICD-10-CM

## 2023-03-04 DIAGNOSIS — Z603 Acculturation difficulty: Secondary | ICD-10-CM

## 2023-03-04 DIAGNOSIS — Z758 Other problems related to medical facilities and other health care: Secondary | ICD-10-CM

## 2023-03-04 DIAGNOSIS — O365930 Maternal care for other known or suspected poor fetal growth, third trimester, not applicable or unspecified: Secondary | ICD-10-CM

## 2023-03-04 DIAGNOSIS — J069 Acute upper respiratory infection, unspecified: Secondary | ICD-10-CM

## 2023-03-04 NOTE — Progress Notes (Signed)
    PRENATAL VISIT NOTE  Subjective:  Latasha Powell is a 32 y.o. [phone] at [redacted]w[redacted]d being seen today for ongoing prenatal care.  She is currently monitored for the following issues for this high-risk pregnancy and has Late prenatal care affecting pregnancy in second trimester; Language barrier; History of cesarean delivery, currently pregnant; and IUGR (intrauterine growth restriction) affecting care of mother on their problem list.  Patient reports  URI s/s .  Contractions: Irregular. Vag. Bleeding: None.  Movement: Present. Denies leaking of fluid.   The following portions of the patient's history were reviewed and updated as appropriate: allergies, current medications, past family history, past medical history, past social history, past surgical history and problem list.   Objective:   Vitals:   03/04/23 1032  BP: 121/79  Pulse: 86  Weight: 161 lb (73 kg)    Fetal Status: Fetal Heart Rate (bpm): 127   Movement: Present     General:  Alert, oriented and cooperative. Patient is in no acute distress.  Skin: Skin is warm and dry. No rash noted.   Cardiovascular: Normal heart rate noted  Respiratory: Normal respiratory effort, no problems with respiration noted  Abdomen: Soft, gravid, appropriate for gestational age.  Pain/Pressure: Present     Pelvic: Cervical exam deferred        Extremities: Normal range of motion.  Edema: None  Mental Status: Normal mood and affect. Normal behavior. Normal judgment and thought content.   Assessment and Plan:  Pregnancy: [id number] at [redacted]w[redacted]d 1. [redacted] weeks gestation of pregnancy Still desiring BTL. GBS neg  2. History of cesarean delivery, currently pregnant For rpt on 8/26  3. Poor fetal growth affecting management of mother in third trimester, single or unspecified fetus Delivery at 38-39wks. [facility]KC precautions given 7/31: 3.9%, 2088gm, ac 12%, ua wnl, bpp 8/8, cephalic; 38-39wk delivery 8/14: normal UA dopplers, afi 11.7, 8/8 bpp,  cephalic Continue with weekly testing.  4. Upper respiratory tract infection, unspecified type OTCs, MAU precautions given  5. Language barrier Speeaks good english. In person interpreter used  Term labor symptoms and general obstetric precautions including but not limited to vaginal bleeding, contractions, leaking of fluid and fetal movement were reviewed in detail with the patient. Please refer to After Visit Summary for other counseling recommendations.   Return in about 5 days (around 03/09/2023) for in person, md or app, high risk ob.  Future Appointments  Date Time Provider Department Center  03/09/2023  2:30 PM [facility] NURSE [facility]  03/09/2023  2:45 PM [facility] US6 [facility]  03/11/2023 11:00 AM [facility] PAT 1 [facility]IN[facility] None    [place] Bing, MD

## 2023-03-05 ENCOUNTER — Inpatient Hospital Stay (HOSPITAL_COMMUNITY)
Admission: AD | Admit: 2023-03-05 | Discharge: 2023-03-06 | Disposition: A | Discharge status: Home / Self Care | Payer: 59 | Source: Home / Self Care | Attending: Obstetrics and Gynecology | Admitting: Obstetrics and Gynecology

## 2023-03-05 ENCOUNTER — Inpatient Hospital Stay (HOSPITAL_COMMUNITY): Payer: 59

## 2023-03-05 ENCOUNTER — Encounter (HOSPITAL_COMMUNITY): Payer: Self-pay | Admitting: Obstetrics and Gynecology

## 2023-03-05 DIAGNOSIS — Z603 Acculturation difficulty: Secondary | ICD-10-CM | POA: Insufficient documentation

## 2023-03-05 DIAGNOSIS — R0602 Shortness of breath: Secondary | ICD-10-CM

## 2023-03-05 DIAGNOSIS — O471 False labor at or after 37 completed weeks of gestation: Secondary | ICD-10-CM | POA: Insufficient documentation

## 2023-03-05 DIAGNOSIS — O98513 Other viral diseases complicating pregnancy, third trimester: Secondary | ICD-10-CM | POA: Insufficient documentation

## 2023-03-05 DIAGNOSIS — O34211 Maternal care for low transverse scar from previous cesarean delivery: Secondary | ICD-10-CM | POA: Insufficient documentation

## 2023-03-05 DIAGNOSIS — O479 False labor, unspecified: Secondary | ICD-10-CM

## 2023-03-05 DIAGNOSIS — U071 COVID-19: Secondary | ICD-10-CM | POA: Insufficient documentation

## 2023-03-05 DIAGNOSIS — Z758 Other problems related to medical facilities and other health care: Secondary | ICD-10-CM

## 2023-03-05 DIAGNOSIS — Z3A38 38 weeks gestation of pregnancy: Secondary | ICD-10-CM

## 2023-03-05 LAB — SARS CORONAVIRUS 2 BY RT PCR: SARS Coronavirus 2 by RT PCR: POSITIVE — AB

## 2023-03-05 LAB — COMPREHENSIVE METABOLIC PANEL
ALT: 15 U/L (ref 0–44)
AST: 19 U/L (ref 15–41)
Albumin: 2.5 g/dL — ABNORMAL LOW (ref 3.5–5.0)
Alkaline Phosphatase: 251 U/L — ABNORMAL HIGH (ref 38–126)
Anion gap: 10 (ref 5–15)
BUN: 7 mg/dL (ref 6–20)
CO2: 18 mmol/L — ABNORMAL LOW (ref 22–32)
Calcium: 8.5 mg/dL — ABNORMAL LOW (ref 8.9–10.3)
Chloride: 108 mmol/L (ref 98–111)
Creatinine, Ser: 0.57 mg/dL (ref 0.44–1.00)
GFR, Estimated: >60 mL/min (ref >60)
Glucose, Bld: 81 mg/dL (ref 70–99)
Potassium: 3.9 mmol/L (ref 3.5–5.1)
Sodium: 136 mmol/L (ref 135–145)
Total Bilirubin: 0.7 mg/dL (ref 0.3–1.2)
Total Protein: 6 g/dL — ABNORMAL LOW (ref 6.5–8.1)

## 2023-03-05 LAB — CBC
HCT: 34.9 % — ABNORMAL LOW (ref 36.0–46.0)
Hemoglobin: 11.5 g/dL — ABNORMAL LOW (ref 12.0–15.0)
MCH: 28.4 pg (ref 26.0–34.0)
MCHC: 33 g/dL (ref 30.0–36.0)
MCV: 86.2 fL (ref 80.0–100.0)
Platelets: 245 10*3/uL (ref 150–400)
RBC: 4.05 MIL/uL (ref 3.87–5.11)
RDW: 13.1 % (ref 11.5–15.5)
WBC: 6.8 10*3/uL (ref 4.0–10.5)
nRBC: 0 % (ref 0.0–0.2)

## 2023-03-05 MED ORDER — LACTATED RINGERS IV BOLUS
1000.0000 mL | Freq: Once | INTRAVENOUS | Status: AC
  Administered 2023-03-05: 1000 mL via INTRAVENOUS

## 2023-03-05 NOTE — MAU Provider Note (Signed)
Chief Complaint:  Contractions, Nausea, Shortness of Breath, and Nasal Congestion   Event Date/Time   First Provider Initiated Contact with Patient 03/05/23 2152      HPI: Latasha Powell is a 32 y.o. [id number] at [redacted]w[redacted]d by early [place] with hx significant for C/S x 2,  FGR EFW 3.9% who presents to maternity admissions reporting onset of nasal congestion 2 days ago with shortness of breath and abdominal cramping developing last night, worsening today.  There is associated chills without evidence of fever, cough, and nausea.  She took Tylenol at 4 pm today, 03/05/23 because she felt feverish.  She denies sore throat or vomiting.   She reports good fetal movement, denies LOF or vaginal bleeding  HPI  Past Medical History: Past Medical History:  Diagnosis Date   Medical history non-contributory    Open fracture of right elbow 10/25/2019   Pregnancy headache in third trimester 01/13/2023   Supervision of other normal pregnancy, antepartum 11/30/2022              NURSING     PROVIDER      Office Location    [facility]    Dating by    U/S at 7 wks      [facility] Model    Traditional    Anatomy U/S           Initiated care at     [facility]     English but may need spanish interpreter assist.                      LAB RESULTS       Support Person    Javier FOB    Genetics    NIPS:  low risk  girl         AFP: late t    Past obstetric history: OB History  Gravida Para Term Preterm AB Living  4 2 2   1 2   SAB IAB Ectopic Multiple Live Births  1   0   2    # Outcome Date GA Lbr Len/2nd Weight Sex Type Anes PTL Lv  4 Current           3 SAB 01/2022          2 Term 10/30/11 [redacted]w[redacted]d   F CS-LTranv   LIV  1 Term 10/31/06 [redacted]w[redacted]d   F CS-LTranv   LIV    Past Surgical History: Past Surgical History:  Procedure Laterality Date   APPENDECTOMY     CAPSULAR RELEASE Right 03/20/2020   Procedure: RIGHT ELBOW ARTHROTOMY W/CAPSULAR RELEASE;  Surgeon: Bjorn Pippin, MD;   Location: [facility];  Service: Orthopedics;  Laterality: Right;   CESAREAN SECTION     CESAREAN SECTION     X2   I & D EXTREMITY Right 10/25/2019   Procedure: Right elbow joint incision debridement with excisional debridement of bone and tendon as well as subcutaneous tissue.  Right deep wound incision debridement with excisional debridement of skin ,muscle, and fascia. ;  Surgeon: Bjorn Pippin, MD;  Location: [facility];  Service: Orthopedics;  Laterality: Right;   I & D EXTREMITY Right 10/29/2019   Procedure: IRRIGATION AND DEBRIDEMENT EXTREMITY;  Surgeon: Bjorn Pippin, MD;  Location: [facility] OR;  Service: Orthopedics;  Laterality:  Right;   TRICEPS TENDON REPAIR Right 10/29/2019   Procedure: TRICEPS TENDON REPAIR;  Surgeon: Bjorn Pippin, MD;  Location: [facility];  Service: Orthopedics;  Laterality: Right;   ULNAR NERVE TRANSPOSITION Right 03/20/2020   Procedure: NEUROPLASTY AND TRANSPOSITION ULNAR NERVE AT ELBOW;  Surgeon: Bjorn Pippin, MD;  Location: [facility];  Service: Orthopedics;  Laterality: Right;    Family History: Family History  Problem Relation Age of Onset   Healthy Mother    Healthy Father    Asthma Neg Hx    Cancer Neg Hx    Diabetes Neg Hx    Heart disease Neg Hx    Hypertension Neg Hx     Social History: Social History   Tobacco Use   Smoking status: Former    Types: E-cigarettes   Smokeless tobacco: Never  Vaping Use   Vaping status: Former   Substances: Nicotine   Devices: QUIT 1 YEAR AGO (only for a month)  Substance Use Topics   Alcohol use: Not Currently    Comment: SOCIAL   Drug use: Never    Allergies: No Known Allergies  Meds:  Medications Prior to Admission  Medication Sig Dispense Refill Last Dose   Prenatal Vit-Fe Fumarate-FA (PREPLUS) 27-1 MG TABS Take 1 tablet by mouth daily. 30 tablet 13    promethazine (PHENERGAN) 25 MG tablet Take 1 tablet (25 mg total) by mouth every 6 (six) hours as needed for nausea or vomiting. 30  tablet 0     ROS:  Review of Systems  Constitutional:  Positive for chills. Negative for fatigue and fever.  HENT:  Positive for congestion and rhinorrhea. Negative for sore throat.   Eyes:  Negative for visual disturbance.  Respiratory:  Positive for cough and shortness of breath.   Cardiovascular:  Negative for chest pain.  Gastrointestinal:  Positive for abdominal pain and nausea. Negative for vomiting.  Genitourinary:  Negative for difficulty urinating, dysuria, flank pain, pelvic pain, vaginal bleeding, vaginal discharge and vaginal pain.  Musculoskeletal:  Positive for back pain.  Neurological:  Negative for dizziness and headaches.  Psychiatric/Behavioral: Negative.       I have reviewed patient's Past Medical Hx, Surgical Hx, Family Hx, Social Hx, medications and allergies.   Physical Exam  Patient Vitals for the past 24 hrs:  BP Temp Temp src Pulse Resp SpO2 Height Weight  03/06/23 0217 118/71 97.9 F (36.6 C) Oral 83 20 100 % -- --  03/05/23 2200 -- -- -- -- -- 99 % -- --  03/05/23 2150 -- -- -- -- -- 99 % -- --  03/05/23 2117 -- -- -- -- -- 99 % -- --  03/05/23 2055 134/85 98.2 F (36.8 C) Oral 99 17 100 % 5\' 4"  (1.626 m) 73.3 kg   Constitutional: Well-developed, well-nourished female in mild distress.  Cardiovascular: normal rate Respiratory: normal effort GI: Abd soft, non-tender, gravid appropriate for gestational age.  MS: Extremities nontender, no edema, normal ROM Neurologic: Alert and oriented x 4.  GU: Neg CVAT.  PELVIC EXAM: Cervix pink, visually closed, without lesion, scant white creamy discharge, vaginal walls and external genitalia normal Bimanual exam: Cervix 0/long/high, firm, anterior, neg CMT, uterus nontender, nonenlarged, adnexa without tenderness, enlargement, or mass  Dilation: Fingertip Effacement (%): Thick Station: -3 Exam by:: Games developer  FHT:  Baseline 135 , moderate variability, accelerations present, no  decelerations Contractions: q 8-12 mins   Labs: Results for orders placed or performed during the hospital encounter  of 03/05/23 (from the past 24 hour(s))  CBC     Status: Abnormal   Collection Time: 03/05/23 10:45 PM  Result Value Ref Range   WBC 6.8 4.0 - 10.5 K/uL   RBC 4.05 3.87 - 5.11 MIL/uL   Hemoglobin 11.5 (L) 12.0 - 15.0 g/dL   HCT 40.9 (L) 81.1 - 91.4 %   MCV 86.2 80.0 - 100.0 fL   MCH 28.4 26.0 - 34.0 pg   MCHC 33.0 30.0 - 36.0 g/dL   RDW 78.2 95.6 - 21.3 %   Platelets 245 150 - 400 K/uL   nRBC 0.0 0.0 - 0.2 %  Comprehensive metabolic panel     Status: Abnormal   Collection Time: 03/05/23 10:45 PM  Result Value Ref Range   Sodium 136 135 - 145 mmol/L   Potassium 3.9 3.5 - 5.1 mmol/L   Chloride 108 98 - 111 mmol/L   CO2 18 (L) 22 - 32 mmol/L   Glucose, Bld 81 70 - 99 mg/dL   BUN 7 6 - 20 mg/dL   Creatinine, Ser 0.86 0.44 - 1.00 mg/dL   Calcium 8.5 (L) 8.9 - 10.3 mg/dL   Total Protein 6.0 (L) 6.5 - 8.1 g/dL   Albumin 2.5 (L) 3.5 - 5.0 g/dL   AST 19 15 - 41 U/L   ALT 15 0 - 44 U/L   Alkaline Phosphatase 251 (H) 38 - 126 U/L   Total Bilirubin 0.7 0.3 - 1.2 mg/dL   GFR, Estimated >57 >84 mL/min   Anion gap 10 5 - 15  SARS Coronavirus 2 by RT PCR (hospital order, performed in [facility] hospital lab) *cepheid single result test* Anterior Nasal Swab     Status: Abnormal   Collection Time: 03/05/23 10:45 PM   Specimen: Anterior Nasal Swab  Result Value Ref Range   SARS Coronavirus 2 by RT PCR POSITIVE (A) NEGATIVE  Respiratory (~20 pathogens) panel by PCR     Status: None   Collection Time: 03/05/23 10:45 PM   Specimen: Nasopharyngeal Swab; Respiratory  Result Value Ref Range   Adenovirus NOT DETECTED NOT DETECTED   Coronavirus 229E NOT DETECTED NOT DETECTED   Coronavirus HKU1 NOT DETECTED NOT DETECTED   Coronavirus NL63 NOT DETECTED NOT DETECTED   Coronavirus OC43 NOT DETECTED NOT DETECTED   Metapneumovirus NOT DETECTED NOT DETECTED   Rhinovirus / Enterovirus  NOT DETECTED NOT DETECTED   Influenza A NOT DETECTED NOT DETECTED   Influenza B NOT DETECTED NOT DETECTED   Parainfluenza Virus 1 NOT DETECTED NOT DETECTED   Parainfluenza Virus 2 NOT DETECTED NOT DETECTED   Parainfluenza Virus 3 NOT DETECTED NOT DETECTED   Parainfluenza Virus 4 NOT DETECTED NOT DETECTED   Respiratory Syncytial Virus NOT DETECTED NOT DETECTED   Bordetella pertussis NOT DETECTED NOT DETECTED   Bordetella Parapertussis NOT DETECTED NOT DETECTED   Chlamydophila pneumoniae NOT DETECTED NOT DETECTED   Mycoplasma pneumoniae NOT DETECTED NOT DETECTED  Urinalysis, Routine w reflex microscopic -Urine, Clean Catch     Status: None   Collection Time: 03/05/23 10:49 PM  Result Value Ref Range   Color, Urine YELLOW YELLOW   APPearance CLEAR CLEAR   Specific Gravity, Urine 1.016 1.005 - 1.030   pH 6.0 5.0 - 8.0   Glucose, UA NEGATIVE NEGATIVE mg/dL   Hgb urine dipstick NEGATIVE NEGATIVE   Bilirubin Urine NEGATIVE NEGATIVE   Ketones, ur NEGATIVE NEGATIVE mg/dL   Protein, ur NEGATIVE NEGATIVE mg/dL   Nitrite NEGATIVE NEGATIVE  Leukocytes,Ua NEGATIVE NEGATIVE   O/Positive/-- (05/14 1142)  Imaging:  DG Chest Portable 1 View  Result Date: 03/05/2023 CLINICAL DATA:  Shortness of breath. EXAM: PORTABLE CHEST 1 VIEW COMPARISON:  10/26/2019. FINDINGS: The heart is borderline enlarged and mediastinal contours are within normal limits. The pulmonary vasculature is mildly distended. No consolidation, effusion, or pneumothorax. No acute osseous abnormality. IMPRESSION: Pulmonary vascular congestion. Electronically Signed   By: Thornell Sartorius M.D.   On: 03/05/2023 22:36    MAU Course/MDM: Orders Placed This Encounter  Procedures   SARS Coronavirus 2 by RT PCR (hospital order, performed in [facility] hospital lab) *cepheid single result test* Anterior Nasal Swab   Respiratory (~20 pathogens) panel by PCR   DG Chest Portable 1 View   CBC   Comprehensive metabolic panel   Urinalysis,  Routine w reflex microscopic -Urine, Clean Catch   Droplet precaution   Discharge patient    Meds ordered this encounter  Medications   lactated ringers bolus 1,000 mL   DISCONTD: albuterol (PROVENTIL) (2.5 MG/3ML) 0.083% nebulizer solution 2.5 mg   albuterol (VENTOLIN HFA) 108 (90 Base) MCG/ACT inhaler 1-2 puff   nirmatrelvir & ritonavir (PAXLOVID, 300/100,) 20 x 150 MG & 10 x 100MG  TBPK    Sig: Take 3 tablets by mouth 2 (two) times daily for 5 days.    Dispense:  30 tablet    Refill:  0    Order Specific Question:   Supervising Provider    Answer:   Milas Hock [[id number]]   oxyCODONE-acetaminophen (PERCOCET/ROXICET) 5-325 MG per tablet 2 tablet     NST reviewed and reactive with isolated variable deceleration x 1 Pt with irregular contractions and cervix unchanged in 2+ hours in MAU COVID test positive, CXR normal Nebulizer breathing tx ordered, but significant wait for respiratory therapy so albuterol inhaler tx done in MAU instead, and pt sent home remaining inhaler Pt with improved shortness of breath, but feeling feverish, body aches, back pain Percocet x 2 given in MAU Consult Dr Para March with presentation, exam findings and test results. FHR tracing reviewed C/S moved to Tuesday, 03/08/23 for FGR per MFM recommendations Paxlovid Rx sent to pharmacy Video spanish interpreter used for all communication Return precautions reviewed   Assessment: 1. COVID-19 affecting pregnancy in third trimester   2. Shortness of breath   3. [redacted] weeks gestation of pregnancy   4. Uterine contractions   5. Language barrier     Plan: Discharge home Labor precautions and fetal kick counts  Follow-up Information     [facility] Follow up.   Specialty: Obstetrics and Gynecology Why: Check in at [facility] registration on 03/08/23 as scheduled for your cesarean delivery.  Return to MAU as needed for signs of labor or emergencies Contact information: [address] [place] [address] [phone]               Allergies as of 03/06/2023   No Known Allergies      Medication List     TAKE these medications    Paxlovid (300/100) 20 x 150 MG & 10 x 100MG  Tbpk Generic drug: nirmatrelvir & ritonavir Take 3 tablets by mouth 2 (two) times daily for 5 days.   PrePLUS 27-1 MG Tabs Take 1 tablet by mouth daily.   promethazine 25 MG tablet Commonly known as: PHENERGAN Take 1 tablet (25 mg total) by mouth every 6 (six) hours as needed for nausea or vomiting.  Sharen Counter Certified Nurse-Midwife 03/06/2023 2:45 AM

## 2023-03-05 NOTE — MAU Note (Signed)
..  Latasha Powell is a 32 y.o. at [redacted]w[redacted]d here in MAU reporting: contractions every 4 minutes. Shortness of breath for a few hours and nausea.  Nasal congestion since Thursday.  +FM, denies vaginal bleeding or leaking of fluid.   2 Previous c-sections Took 500 mg of tylenol around 4 pm.  Pain score: 7/10 Vitals:   03/05/23 2055  BP: 134/85  Pulse: 99  Resp: 17  Temp: 98.2 F (36.8 C)  SpO2: 100%     FHT:130 Lab orders placed from triage:  none

## 2023-03-06 LAB — RESPIRATORY PANEL BY PCR

## 2023-03-06 LAB — URINALYSIS, ROUTINE W REFLEX MICROSCOPIC
Bilirubin Urine: NEGATIVE
Glucose, UA: NEGATIVE mg/dL
Hgb urine dipstick: NEGATIVE
Ketones, ur: NEGATIVE mg/dL
Leukocytes,Ua: NEGATIVE
Nitrite: NEGATIVE
Protein, ur: NEGATIVE mg/dL
Specific Gravity, Urine: 1.016 (ref 1.005–1.030)
pH: 6 (ref 5.0–8.0)

## 2023-03-06 MED ORDER — ALBUTEROL SULFATE (2.5 MG/3ML) 0.083% IN NEBU: 2.5000 mg | INHALATION_SOLUTION | Freq: Once | RESPIRATORY_TRACT | Status: DC

## 2023-03-06 MED ORDER — PAXLOVID (300/100) 20 X 150 MG & 10 X 100MG PO TBPK: 3.0000 | ORAL_TABLET | Freq: Two times a day (BID) | ORAL | 0 refills | Status: DC

## 2023-03-06 MED ORDER — OXYCODONE-ACETAMINOPHEN 5-325 MG PO TABS
2.0000 | ORAL_TABLET | Freq: Once | ORAL | Status: AC
  Administered 2023-03-06: 2 via ORAL
  Filled 2023-03-06: qty 2

## 2023-03-06 MED ORDER — ALBUTEROL SULFATE HFA 108 (90 BASE) MCG/ACT IN AERS
1.0000 | INHALATION_SPRAY | RESPIRATORY_TRACT | Status: DC | PRN
  Administered 2023-03-06: 2 via RESPIRATORY_TRACT
  Filled 2023-03-06: qty 6.7

## 2023-03-06 NOTE — Discharge Instructions (Signed)
Razones para volver a MAU/[facility]:  1. Las contracciones son cada 5 minutos o menos, cada uno de los ltimos 1 minuto, stos han llevado a cabo durante 1-2 horas, y no se puede caminar o hablar durante ellas 2. Usted tiene un gran chorro de lquido o un goteo de lquido que no se detiene y hay que usar una toalla 3. Ha de sangrado que es de color rojo brillante, denso que el manchado - como sangrado menstrual (manchado puede ser normal en trabajo de parto prematuro o despus de una comprobacin de su cuello uterino) 4. Usted no se siente el beb se mueve como l / ella hace normalmente  

## 2023-03-07 ENCOUNTER — Encounter (HOSPITAL_COMMUNITY): Payer: Self-pay | Admitting: Family Medicine

## 2023-03-07 ENCOUNTER — Telehealth (HOSPITAL_COMMUNITY): Payer: Self-pay | Admitting: *Deleted

## 2023-03-07 ENCOUNTER — Encounter (HOSPITAL_COMMUNITY): Payer: Self-pay

## 2023-03-07 NOTE — Telephone Encounter (Signed)
[id number] interpreter number

## 2023-03-08 ENCOUNTER — Other Ambulatory Visit: Payer: Self-pay

## 2023-03-08 ENCOUNTER — Encounter (HOSPITAL_COMMUNITY)
Admission: RE | Disposition: A | Discharge status: Home / Self Care | Payer: Self-pay | Source: Home / Self Care | Attending: Family Medicine

## 2023-03-08 ENCOUNTER — Inpatient Hospital Stay (HOSPITAL_COMMUNITY): Payer: 59 | Admitting: Anesthesiology

## 2023-03-08 ENCOUNTER — Inpatient Hospital Stay (HOSPITAL_COMMUNITY)
Admission: RE | Admit: 2023-03-08 | Discharge: 2023-03-11 | DRG: 783 | Disposition: A | Discharge status: Home / Self Care | Payer: 59 | Attending: Family Medicine | Admitting: Family Medicine

## 2023-03-08 ENCOUNTER — Encounter (HOSPITAL_COMMUNITY): Payer: Self-pay | Admitting: Family Medicine

## 2023-03-08 DIAGNOSIS — O365930 Maternal care for other known or suspected poor fetal growth, third trimester, not applicable or unspecified: Secondary | ICD-10-CM | POA: Diagnosis present

## 2023-03-08 DIAGNOSIS — Z5986 Financial insecurity: Secondary | ICD-10-CM

## 2023-03-08 DIAGNOSIS — U071 COVID-19: Secondary | ICD-10-CM | POA: Diagnosis not present

## 2023-03-08 DIAGNOSIS — Z302 Encounter for sterilization: Secondary | ICD-10-CM

## 2023-03-08 DIAGNOSIS — Z3A38 38 weeks gestation of pregnancy: Secondary | ICD-10-CM

## 2023-03-08 DIAGNOSIS — O34219 Maternal care for unspecified type scar from previous cesarean delivery: Secondary | ICD-10-CM | POA: Diagnosis not present

## 2023-03-08 DIAGNOSIS — O9081 Anemia of the puerperium: Secondary | ICD-10-CM | POA: Diagnosis not present

## 2023-03-08 DIAGNOSIS — O9852 Other viral diseases complicating childbirth: Secondary | ICD-10-CM | POA: Diagnosis not present

## 2023-03-08 DIAGNOSIS — O34211 Maternal care for low transverse scar from previous cesarean delivery: Principal | ICD-10-CM | POA: Diagnosis present

## 2023-03-08 DIAGNOSIS — Z87891 Personal history of nicotine dependence: Secondary | ICD-10-CM | POA: Diagnosis not present

## 2023-03-08 DIAGNOSIS — D62 Acute posthemorrhagic anemia: Secondary | ICD-10-CM | POA: Diagnosis not present

## 2023-03-08 DIAGNOSIS — Z98891 History of uterine scar from previous surgery: Secondary | ICD-10-CM

## 2023-03-08 LAB — COMPREHENSIVE METABOLIC PANEL
ALT: 22 U/L (ref 0–44)
AST: 40 U/L (ref 15–41)
Albumin: 2.2 g/dL — ABNORMAL LOW (ref 3.5–5.0)
Alkaline Phosphatase: 250 U/L — ABNORMAL HIGH (ref 38–126)
Anion gap: 10 (ref 5–15)
BUN: 8 mg/dL (ref 6–20)
CO2: 19 mmol/L — ABNORMAL LOW (ref 22–32)
Calcium: 8.5 mg/dL — ABNORMAL LOW (ref 8.9–10.3)
Chloride: 104 mmol/L (ref 98–111)
Creatinine, Ser: 0.68 mg/dL (ref 0.44–1.00)
GFR, Estimated: >60 mL/min (ref >60)
Glucose, Bld: 164 mg/dL — ABNORMAL HIGH (ref 70–99)
Potassium: 3.8 mmol/L (ref 3.5–5.1)
Sodium: 133 mmol/L — ABNORMAL LOW (ref 135–145)
Total Bilirubin: 0.5 mg/dL (ref 0.3–1.2)
Total Protein: 5.6 g/dL — ABNORMAL LOW (ref 6.5–8.1)

## 2023-03-08 LAB — CBC
HCT: 35 % — ABNORMAL LOW (ref 36.0–46.0)
Hemoglobin: 11.4 g/dL — ABNORMAL LOW (ref 12.0–15.0)
MCH: 28.1 pg (ref 26.0–34.0)
MCHC: 32.6 g/dL (ref 30.0–36.0)
MCV: 86.4 fL (ref 80.0–100.0)
Platelets: 240 10*3/uL (ref 150–400)
RBC: 4.05 MIL/uL (ref 3.87–5.11)
RDW: 13.2 % (ref 11.5–15.5)
WBC: 7.8 10*3/uL (ref 4.0–10.5)
nRBC: 0 % (ref 0.0–0.2)

## 2023-03-08 LAB — TYPE AND SCREEN
ABO/RH(D): O POS
Antibody Screen: NEGATIVE

## 2023-03-08 LAB — ABO/RH: ABO/RH(D): O POS

## 2023-03-08 SURGERY — Surgical Case
Anesthesia: Spinal

## 2023-03-08 MED ORDER — KETOROLAC TROMETHAMINE 30 MG/ML IJ SOLN
INTRAMUSCULAR | Status: AC
  Filled 2023-03-08: qty 1

## 2023-03-08 MED ORDER — FENTANYL CITRATE (PF) 100 MCG/2ML IJ SOLN
INTRAMUSCULAR | Status: AC
  Filled 2023-03-08: qty 2

## 2023-03-08 MED ORDER — DIPHENHYDRAMINE HCL 25 MG PO CAPS: 25.0000 mg | ORAL_CAPSULE | ORAL | Status: DC | PRN

## 2023-03-08 MED ORDER — ONDANSETRON HCL 4 MG/2ML IJ SOLN: 4.0000 mg | Freq: Three times a day (TID) | INTRAMUSCULAR | Status: DC | PRN

## 2023-03-08 MED ORDER — AMISULPRIDE (ANTIEMETIC) 5 MG/2ML IV SOLN: 10.0000 mg | Freq: Once | INTRAVENOUS | Status: DC | PRN

## 2023-03-08 MED ORDER — DIPHENHYDRAMINE HCL 50 MG/ML IJ SOLN: 12.5000 mg | INTRAMUSCULAR | Status: DC | PRN

## 2023-03-08 MED ORDER — KETOROLAC TROMETHAMINE 30 MG/ML IJ SOLN: 30.0000 mg | Freq: Once | INTRAMUSCULAR | Status: DC | PRN

## 2023-03-08 MED ORDER — SCOPOLAMINE 1 MG/3DAYS TD PT72: 1.0000 | MEDICATED_PATCH | Freq: Once | TRANSDERMAL | Status: DC

## 2023-03-08 MED ORDER — WITCH HAZEL-GLYCERIN EX PADS: 1.0000 | MEDICATED_PAD | CUTANEOUS | Status: DC | PRN

## 2023-03-08 MED ORDER — SCOPOLAMINE 1 MG/3DAYS TD PT72
MEDICATED_PATCH | TRANSDERMAL | Status: DC | PRN
  Administered 2023-03-08: 1 via TRANSDERMAL

## 2023-03-08 MED ORDER — SODIUM CHLORIDE 0.9% FLUSH: 3.0000 mL | INTRAVENOUS | Status: DC | PRN

## 2023-03-08 MED ORDER — ONDANSETRON HCL 4 MG/2ML IJ SOLN: 4.0000 mg | Freq: Once | INTRAMUSCULAR | Status: DC | PRN

## 2023-03-08 MED ORDER — KETOROLAC TROMETHAMINE 30 MG/ML IJ SOLN
30.0000 mg | Freq: Four times a day (QID) | INTRAMUSCULAR | Status: AC
  Administered 2023-03-08 – 2023-03-09 (×3): 30 mg via INTRAVENOUS
  Filled 2023-03-08 (×3): qty 1

## 2023-03-08 MED ORDER — STERILE WATER FOR IRRIGATION IR SOLN
Status: DC | PRN
  Administered 2023-03-08: 1

## 2023-03-08 MED ORDER — MAGNESIUM HYDROXIDE 400 MG/5ML PO SUSP: 30.0000 mL | ORAL | Status: DC | PRN

## 2023-03-08 MED ORDER — CEFAZOLIN SODIUM-DEXTROSE 2-4 GM/100ML-% IV SOLN
2.0000 g | INTRAVENOUS | Status: AC
  Administered 2023-03-08: 2 g via INTRAVENOUS

## 2023-03-08 MED ORDER — LIDOCAINE HCL 1 % IJ SOLN
INTRAMUSCULAR | Status: AC
  Filled 2023-03-08: qty 20

## 2023-03-08 MED ORDER — HYDROMORPHONE HCL 1 MG/ML IJ SOLN: 0.2500 mg | INTRAMUSCULAR | Status: DC | PRN

## 2023-03-08 MED ORDER — SOD CITRATE-CITRIC ACID 500-334 MG/5ML PO SOLN
ORAL | Status: AC
  Filled 2023-03-08: qty 30

## 2023-03-08 MED ORDER — DIBUCAINE (PERIANAL) 1 % EX OINT: 1.0000 | TOPICAL_OINTMENT | CUTANEOUS | Status: DC | PRN

## 2023-03-08 MED ORDER — KETOROLAC TROMETHAMINE 30 MG/ML IJ SOLN
INTRAMUSCULAR | Status: DC | PRN
  Administered 2023-03-08: 30 mg via INTRAVENOUS

## 2023-03-08 MED ORDER — OXYCODONE HCL 5 MG PO TABS
5.0000 mg | ORAL_TABLET | ORAL | Status: DC | PRN
  Administered 2023-03-09 (×2): 5 mg via ORAL
  Administered 2023-03-10 – 2023-03-11 (×3): 10 mg via ORAL
  Filled 2023-03-08: qty 1
  Filled 2023-03-08 (×4): qty 2

## 2023-03-08 MED ORDER — KETOROLAC TROMETHAMINE 30 MG/ML IJ SOLN: 30.0000 mg | Freq: Four times a day (QID) | INTRAMUSCULAR | Status: DC | PRN

## 2023-03-08 MED ORDER — PHENYLEPHRINE 80 MCG/ML (10ML) SYRINGE FOR IV PUSH (FOR BLOOD PRESSURE SUPPORT)
PREFILLED_SYRINGE | INTRAVENOUS | Status: AC
  Filled 2023-03-08: qty 10

## 2023-03-08 MED ORDER — MORPHINE SULFATE (PF) 0.5 MG/ML IJ SOLN
INTRAMUSCULAR | Status: AC
  Filled 2023-03-08: qty 10

## 2023-03-08 MED ORDER — TRANEXAMIC ACID-NACL 1000-0.7 MG/100ML-% IV SOLN
1000.0000 mg | Freq: Once | INTRAVENOUS | Status: AC
  Administered 2023-03-08: 1000 mg via INTRAVENOUS

## 2023-03-08 MED ORDER — MEPERIDINE HCL 25 MG/ML IJ SOLN: 6.2500 mg | INTRAMUSCULAR | Status: DC | PRN

## 2023-03-08 MED ORDER — SOD CITRATE-CITRIC ACID 500-334 MG/5ML PO SOLN
30.0000 mL | ORAL | Status: AC
  Administered 2023-03-08: 30 mL via ORAL

## 2023-03-08 MED ORDER — SCOPOLAMINE 1 MG/3DAYS TD PT72
MEDICATED_PATCH | TRANSDERMAL | Status: AC
  Filled 2023-03-08: qty 1

## 2023-03-08 MED ORDER — POVIDONE-IODINE 10 % EX SWAB
2.0000 | Freq: Once | CUTANEOUS | Status: AC
  Administered 2023-03-08: 2 via TOPICAL

## 2023-03-08 MED ORDER — PHENYLEPHRINE HCL-NACL 20-0.9 MG/250ML-% IV SOLN
INTRAVENOUS | Status: DC | PRN
  Administered 2023-03-08: 60 ug/min via INTRAVENOUS

## 2023-03-08 MED ORDER — NALOXONE HCL 0.4 MG/ML IJ SOLN: 0.4000 mg | INTRAMUSCULAR | Status: DC | PRN

## 2023-03-08 MED ORDER — METOCLOPRAMIDE HCL 5 MG/ML IJ SOLN
INTRAMUSCULAR | Status: DC | PRN
  Administered 2023-03-08: 10 mg via INTRAVENOUS

## 2023-03-08 MED ORDER — PRENATAL MULTIVITAMIN CH
1.0000 | ORAL_TABLET | Freq: Every day | ORAL | Status: DC
  Administered 2023-03-09 – 2023-03-10 (×2): 1 via ORAL
  Filled 2023-03-08 (×3): qty 1

## 2023-03-08 MED ORDER — ONDANSETRON HCL 4 MG/2ML IJ SOLN
INTRAMUSCULAR | Status: AC
  Filled 2023-03-08: qty 2

## 2023-03-08 MED ORDER — IBUPROFEN 600 MG PO TABS
600.0000 mg | ORAL_TABLET | Freq: Four times a day (QID) | ORAL | Status: DC
  Administered 2023-03-09 – 2023-03-11 (×7): 600 mg via ORAL
  Filled 2023-03-08 (×8): qty 1

## 2023-03-08 MED ORDER — GABAPENTIN 100 MG PO CAPS
200.0000 mg | ORAL_CAPSULE | Freq: Every day | ORAL | Status: DC
  Administered 2023-03-08 – 2023-03-10 (×3): 200 mg via ORAL
  Filled 2023-03-08 (×3): qty 2

## 2023-03-08 MED ORDER — ACETAMINOPHEN 500 MG PO TABS: 1000.0000 mg | ORAL_TABLET | Freq: Four times a day (QID) | ORAL | Status: DC

## 2023-03-08 MED ORDER — ENOXAPARIN SODIUM 40 MG/0.4ML IJ SOSY
40.0000 mg | PREFILLED_SYRINGE | INTRAMUSCULAR | Status: DC
  Administered 2023-03-09: 40 mg via SUBCUTANEOUS
  Filled 2023-03-08 (×3): qty 0.4

## 2023-03-08 MED ORDER — LIDOCAINE 1% INJECTION FOR CIRCUMCISION
INJECTION | INTRAVENOUS | Status: DC | PRN
  Administered 2023-03-08: 20 mL via SUBCUTANEOUS

## 2023-03-08 MED ORDER — SENNOSIDES-DOCUSATE SODIUM 8.6-50 MG PO TABS
2.0000 | ORAL_TABLET | Freq: Every day | ORAL | Status: DC
  Administered 2023-03-09 – 2023-03-11 (×3): 2 via ORAL
  Filled 2023-03-08 (×3): qty 2

## 2023-03-08 MED ORDER — METOCLOPRAMIDE HCL 5 MG/ML IJ SOLN
INTRAMUSCULAR | Status: AC
  Filled 2023-03-08: qty 2

## 2023-03-08 MED ORDER — BUPIVACAINE IN DEXTROSE 0.75-8.25 % IT SOLN
INTRATHECAL | Status: DC | PRN
  Administered 2023-03-08: 1.6 mL via INTRATHECAL

## 2023-03-08 MED ORDER — DEXAMETHASONE SODIUM PHOSPHATE 4 MG/ML IJ SOLN
INTRAMUSCULAR | Status: AC
  Filled 2023-03-08: qty 2

## 2023-03-08 MED ORDER — TRANEXAMIC ACID-NACL 1000-0.7 MG/100ML-% IV SOLN
INTRAVENOUS | Status: AC
  Filled 2023-03-08: qty 100

## 2023-03-08 MED ORDER — COCONUT OIL OIL
1.0000 | TOPICAL_OIL | Status: DC | PRN
  Administered 2023-03-11: 1 via TOPICAL

## 2023-03-08 MED ORDER — CEFAZOLIN SODIUM-DEXTROSE 2-4 GM/100ML-% IV SOLN
INTRAVENOUS | Status: AC
  Filled 2023-03-08: qty 100

## 2023-03-08 MED ORDER — FENTANYL CITRATE (PF) 100 MCG/2ML IJ SOLN
INTRAMUSCULAR | Status: DC | PRN
  Administered 2023-03-08: 85 ug via INTRAVENOUS

## 2023-03-08 MED ORDER — OXYTOCIN-SODIUM CHLORIDE 30-0.9 UT/500ML-% IV SOLN: 2.5000 [IU]/h | INTRAVENOUS | Status: AC

## 2023-03-08 MED ORDER — OXYTOCIN-SODIUM CHLORIDE 30-0.9 UT/500ML-% IV SOLN
INTRAVENOUS | Status: DC | PRN
  Administered 2023-03-08: 400 mL via INTRAVENOUS

## 2023-03-08 MED ORDER — ONDANSETRON HCL 4 MG/2ML IJ SOLN
INTRAMUSCULAR | Status: DC | PRN
Start: 2023-03-08 — End: 2023-03-08
  Administered 2023-03-08: 4 mg via INTRAVENOUS

## 2023-03-08 MED ORDER — DEXMEDETOMIDINE HCL IN NACL 80 MCG/20ML IV SOLN
INTRAVENOUS | Status: DC | PRN
  Administered 2023-03-08 (×2): 8 ug via INTRAVENOUS
  Administered 2023-03-08: 4 ug via INTRAVENOUS

## 2023-03-08 MED ORDER — FENTANYL CITRATE (PF) 100 MCG/2ML IJ SOLN
INTRAMUSCULAR | Status: DC | PRN
  Administered 2023-03-08: 15 ug via INTRATHECAL

## 2023-03-08 MED ORDER — SIMETHICONE 80 MG PO CHEW
80.0000 mg | CHEWABLE_TABLET | Freq: Three times a day (TID) | ORAL | Status: DC
  Administered 2023-03-09 – 2023-03-11 (×7): 80 mg via ORAL
  Filled 2023-03-08 (×8): qty 1

## 2023-03-08 MED ORDER — NALOXONE HCL 4 MG/10ML IJ SOLN: 1.0000 ug/kg/h | INTRAVENOUS | Status: DC | PRN

## 2023-03-08 MED ORDER — LACTATED RINGERS IV SOLN: INTRAVENOUS | Status: DC

## 2023-03-08 MED ORDER — SIMETHICONE 80 MG PO CHEW: 80.0000 mg | CHEWABLE_TABLET | ORAL | Status: DC | PRN

## 2023-03-08 MED ORDER — OXYCODONE HCL 5 MG PO TABS: 5.0000 mg | ORAL_TABLET | Freq: Once | ORAL | Status: DC | PRN

## 2023-03-08 MED ORDER — MORPHINE SULFATE (PF) 0.5 MG/ML IJ SOLN
INTRAMUSCULAR | Status: DC | PRN
  Administered 2023-03-08: 150 ug via INTRATHECAL

## 2023-03-08 MED ORDER — OXYTOCIN-SODIUM CHLORIDE 30-0.9 UT/500ML-% IV SOLN
INTRAVENOUS | Status: AC
  Filled 2023-03-08: qty 500

## 2023-03-08 MED ORDER — ACETAMINOPHEN 10 MG/ML IV SOLN
INTRAVENOUS | Status: DC | PRN
  Administered 2023-03-08: 1000 mg via INTRAVENOUS

## 2023-03-08 MED ORDER — OXYCODONE HCL 5 MG/5ML PO SOLN: 5.0000 mg | Freq: Once | ORAL | Status: DC | PRN

## 2023-03-08 MED ORDER — DIPHENHYDRAMINE HCL 25 MG PO CAPS: 25.0000 mg | ORAL_CAPSULE | Freq: Four times a day (QID) | ORAL | Status: DC | PRN

## 2023-03-08 MED ORDER — MENTHOL 3 MG MT LOZG: 1.0000 | LOZENGE | OROMUCOSAL | Status: DC | PRN

## 2023-03-08 MED ORDER — ACETAMINOPHEN 500 MG PO TABS
1000.0000 mg | ORAL_TABLET | Freq: Four times a day (QID) | ORAL | Status: DC
  Administered 2023-03-08 – 2023-03-11 (×10): 1000 mg via ORAL
  Filled 2023-03-08 (×10): qty 2

## 2023-03-08 MED ORDER — DEXAMETHASONE SODIUM PHOSPHATE 10 MG/ML IJ SOLN
INTRAMUSCULAR | Status: DC | PRN
  Administered 2023-03-08: 8 mg via INTRAVENOUS

## 2023-03-08 MED ORDER — PHENYLEPHRINE HCL-NACL 20-0.9 MG/250ML-% IV SOLN
INTRAVENOUS | Status: AC
  Filled 2023-03-08: qty 250

## 2023-03-08 MED ORDER — ACETAMINOPHEN 10 MG/ML IV SOLN
INTRAVENOUS | Status: AC
  Filled 2023-03-08: qty 100

## 2023-03-08 SURGICAL SUPPLY — 35 items
ADH SKN CLS APL DERMABOND .7 (GAUZE/BANDAGES/DRESSINGS) ×2
CATH ROBINSON RED A/P 16FR (CATHETERS) IMPLANT
CHLORAPREP W/TINT 26ML (MISCELLANEOUS) ×2 IMPLANT
CLAMP UMBILICAL CORD (MISCELLANEOUS) ×1 IMPLANT
CLOTH BEACON ORANGE TIMEOUT ST (SAFETY) ×1 IMPLANT
DERMABOND ADVANCED .7 DNX12 (GAUZE/BANDAGES/DRESSINGS) ×2 IMPLANT
DRSG OPSITE POSTOP 4X10 (GAUZE/BANDAGES/DRESSINGS) ×1 IMPLANT
ELECT REM PT RETURN 9FT ADLT (ELECTROSURGICAL) ×1
ELECTRODE REM PT RTRN 9FT ADLT (ELECTROSURGICAL) ×1 IMPLANT
EXTRACTOR VACUUM M CUP 4 TUBE (SUCTIONS) IMPLANT
GAUZE SPONGE 4X4 12PLY STRL LF (GAUZE/BANDAGES/DRESSINGS) IMPLANT
GLOVE BIOGEL PI IND STRL 7.0 (GLOVE) ×1 IMPLANT
GLOVE BIOGEL PI IND STRL 7.5 (GLOVE) ×1 IMPLANT
GLOVE ECLIPSE 7.5 STRL STRAW (GLOVE) ×1 IMPLANT
GOWN STRL REUS W/TWL LRG LVL3 (GOWN DISPOSABLE) ×3 IMPLANT
KIT ABG SYR 3ML LUER SLIP (SYRINGE) IMPLANT
LIGASURE IMPACT 36 18CM CVD LR (INSTRUMENTS) IMPLANT
NDL HYPO 25X5/8 SAFETYGLIDE (NEEDLE) IMPLANT
NEEDLE HYPO 25X5/8 SAFETYGLIDE (NEEDLE) IMPLANT
NS IRRIG 1000ML POUR BTL (IV SOLUTION) ×1 IMPLANT
PACK C SECTION WH (CUSTOM PROCEDURE TRAY) ×1 IMPLANT
PAD OB MATERNITY 4.3X12.25 (PERSONAL CARE ITEMS) ×1 IMPLANT
PENCIL SMOKE EVAC W/HOLSTER (ELECTROSURGICAL) ×1 IMPLANT
RTRCTR C-SECT PINK 25CM LRG (MISCELLANEOUS) ×1 IMPLANT
SUT MNCRL 0 VIOLET CTX 36 (SUTURE) ×2 IMPLANT
SUT PLAIN 2 0 (SUTURE) ×1
SUT PLAIN ABS 2-0 54XMFL TIE (SUTURE) ×1 IMPLANT
SUT VIC AB 0 CTX 36 (SUTURE) ×1
SUT VIC AB 0 CTX36XBRD ANBCTRL (SUTURE) ×1 IMPLANT
SUT VIC AB 2-0 CT1 27 (SUTURE) ×1
SUT VIC AB 2-0 CT1 TAPERPNT 27 (SUTURE) ×1 IMPLANT
SUT VIC AB 4-0 KS 27 (SUTURE) ×1 IMPLANT
TOWEL OR 17X24 6PK STRL BLUE (TOWEL DISPOSABLE) ×1 IMPLANT
TRAY FOLEY W/BAG SLVR 14FR LF (SET/KITS/TRAYS/PACK) ×1 IMPLANT
WATER STERILE IRR 1000ML POUR (IV SOLUTION) ×1 IMPLANT

## 2023-03-08 NOTE — Anesthesia Preprocedure Evaluation (Addendum)
Anesthesia Evaluation  Patient identified by MRN, date of birth, ID band Patient awake    Reviewed: Allergy & Precautions, NPO status , Patient's Chart, lab work & pertinent test results  Airway Mallampati: II  TM Distance: >3 FB Neck ROM: Full    Dental  (+) Teeth Intact, Dental Advisory Given   Pulmonary former smoker COVID + 2d ago   Pulmonary exam normal breath sounds clear to auscultation       Cardiovascular negative cardio ROS Normal cardiovascular exam Rhythm:Regular Rate:Normal     Neuro/Psych  Headaches  negative psych ROS   GI/Hepatic negative GI ROS, Neg liver ROS,,,  Endo/Other  negative endocrine ROS    Renal/GU negative Renal ROS  negative genitourinary   Musculoskeletal negative musculoskeletal ROS (+)    Abdominal   Peds  Hematology  (+) Blood dyscrasia, anemia Hb 11.5, plt 245   Anesthesia Other Findings   Reproductive/Obstetrics (+) Pregnancy Prior section x 2 2008, 2013                             Anesthesia Physical Anesthesia Plan  ASA: 3  Anesthesia Plan: Spinal   Post-op Pain Management: Regional block, Ofirmev IV (intra-op)* and Toradol IV (intra-op)*   Induction:   PONV Risk Score and Plan: 3 and Ondansetron, Dexamethasone and Treatment may vary due to age or medical condition  Airway Management Planned: Natural Airway and Nasal Cannula  Additional Equipment: None  Intra-op Plan:   Post-operative Plan:   Informed Consent: I have reviewed the patients History and Physical, chart, labs and discussed the procedure including the risks, benefits and alternatives for the proposed anesthesia with the patient or authorized representative who has indicated his/her understanding and acceptance.       Plan Discussed with: CRNA  Anesthesia Plan Comments: (COVID + 2d ago- currently asymptomatic )       Anesthesia Quick Evaluation

## 2023-03-08 NOTE — Anesthesia Procedure Notes (Signed)
Spinal  Patient location during procedure: OR Start time: 03/08/2023 3:07 PM End time: 03/08/2023 3:15 PM Reason for block: surgical anesthesia Staffing Performed: anesthesiologist  Anesthesiologist: [name], DO Performed by: [name], DO Authorized by: [name], DO   Preanesthetic Checklist Completed: patient identified, IV checked, risks and benefits discussed, surgical consent, monitors and equipment checked, pre-op evaluation and timeout performed Spinal Block Patient position: sitting Prep: DuraPrep and site prepped and draped Patient monitoring: cardiac monitor, continuous pulse ox and blood pressure Approach: midline Location: L3-4 Injection technique: single-shot Needle Needle type: Pencan  Needle gauge: 24 G Needle length: 9 cm Assessment Sensory level: T6 Events: CSF return Additional Notes Functioning IV was confirmed and monitors were applied. Sterile prep and drape, including hand hygiene and sterile gloves were used. The patient was positioned and the spine was prepped. The skin was anesthetized with lidocaine.  Free flow of clear CSF was obtained prior to injecting local anesthetic into the CSF.  The spinal needle aspirated freely following injection.  The needle was carefully withdrawn.  The patient tolerated the procedure well.   Performed by srna under direct supervision

## 2023-03-08 NOTE — Transfer of Care (Signed)
Immediate Anesthesia Transfer of Care Note  Patient: Latasha Powell  Procedure(s) Performed: REPEAT CESAREAN SECTION WITH BILATERAL TUBAL LIGATION VERTICAL  RLTC  Patient Location: PACU  Anesthesia Type:Spinal  Level of Consciousness: awake, alert , and oriented  Airway & Oxygen Therapy: Patient Spontanous Breathing  Post-op Assessment: Report given to RN and Post -op Vital signs reviewed and stable  Post vital signs: Reviewed and stable  Last Vitals:  Vitals Value Taken Time  BP 112/79 03/08/23 1800  Temp 36.4 C 03/08/23 1745  Pulse 78 03/08/23 1810  Resp 13 03/08/23 1810  SpO2 95 % 03/08/23 1810  Vitals shown include unfiled device data.  Last Pain:  Vitals:   03/08/23 1745  TempSrc: Oral  PainSc:          Complications: No notable events documented.

## 2023-03-08 NOTE — Anesthesia Postprocedure Evaluation (Signed)
Anesthesia Post Note  Patient: Latasha Powell  Procedure(s) Performed: REPEAT CESAREAN SECTION WITH BILATERAL TUBAL LIGATION VERTICAL  RLTC     Patient location during evaluation: PACU Anesthesia Type: Spinal Level of consciousness: awake and alert and oriented Pain management: pain level controlled Vital Signs Assessment: post-procedure vital signs reviewed and stable Respiratory status: spontaneous breathing, nonlabored ventilation and respiratory function stable Cardiovascular status: blood pressure returned to baseline and stable Postop Assessment: no headache, no backache, spinal receding and patient able to bend at knees Anesthetic complications: no   No notable events documented.  Last Vitals:  Vitals:   03/08/23 1730 03/08/23 1745  BP: 122/77 121/72  Pulse: 86 85  Resp: 18 19  Temp:  36.4 C  SpO2: 95% 96%    Last Pain:  Vitals:   03/08/23 1745  TempSrc: Oral  PainSc:                  [name]

## 2023-03-08 NOTE — H&P (Signed)
Faculty Practice H&P  Latasha Powell is a 32 y.o. female [phone] with IUP at [redacted]w[redacted]d presenting for elective repeat cesarean section for FGR. Pregnancy was been complicated by FGR, COVID that started 5 days ago.    Pt states she has been having no contractions, no vaginal bleeding, intact membranes, with normal fetal movement.     Prenatal Course Source of Care:  [facility]  with onset of care at 22weeks  Pregnancy complications or risks: Patient Active Problem List   Diagnosis Date Noted   IUGR (intrauterine growth restriction) affecting care of mother 01/05/2023   Late prenatal care affecting pregnancy in second trimester 11/08/2022   Language barrier 11/08/2022   History of cesarean delivery, currently pregnant 11/08/2022   She desires bilateral tubal ligation for contraception.  She plans to breastfeed, plans to bottle feed  Prenatal labs and studies: ABO, Rh: --/--/O POS (08/20 1215) Antibody: NEG (08/20 1215) Rubella: 1.03 (05/14 1142) RPR: Non Reactive (06/07 1048)  HBsAg: Negative (05/14 1142)  HIV: Non Reactive (06/07 1048)  GBS: Negative/-- (08/08 0441)  2hr Glucola: negative Genetic screening: normal Anatomy US: normal  Past Medical History:  Past Medical History:  Diagnosis Date   Medical history non-contributory    Open fracture of right elbow 10/25/2019   Pregnancy headache in third trimester 01/13/2023   Supervision of other normal pregnancy, antepartum 11/30/2022              NURSING     PROVIDER      Office Location    [facility]    Dating by    U/S at 7 wks      [facility] Model    Traditional    Anatomy U/S           Initiated care at     [facility]     English but may need spanish interpreter assist.                      LAB RESULTS       Support Person    Javier FOB    Genetics    NIPS:  low risk  girl         AFP: late t    Past Surgical History:  Past Surgical History:  Procedure Laterality Date   APPENDECTOMY      CAPSULAR RELEASE Right 03/20/2020   Procedure: RIGHT ELBOW ARTHROTOMY W/CAPSULAR RELEASE;  Surgeon: Bjorn Pippin, MD;  Location: [facility];  Service: Orthopedics;  Laterality: Right;   CESAREAN SECTION     CESAREAN SECTION     X2   I & D EXTREMITY Right 10/25/2019   Procedure: Right elbow joint incision debridement with excisional debridement of bone and tendon as well as subcutaneous tissue.  Right deep wound incision debridement with excisional debridement of skin ,muscle, and fascia. ;  Surgeon: Bjorn Pippin, MD;  Location: [facility] OR;  Service: Orthopedics;  Laterality: Right;   I & D EXTREMITY Right 10/29/2019   Procedure: IRRIGATION AND DEBRIDEMENT EXTREMITY;  Surgeon: Bjorn Pippin, MD;  Location: [facility] OR;  Service: Orthopedics;  Laterality: Right;   TRICEPS TENDON REPAIR Right 10/29/2019   Procedure: TRICEPS TENDON REPAIR;  Surgeon: Bjorn Pippin, MD;  Location: [facility] OR;  Service: Orthopedics;  Laterality: Right;  ULNAR NERVE TRANSPOSITION Right 03/20/2020   Procedure: NEUROPLASTY AND TRANSPOSITION ULNAR NERVE AT ELBOW;  Surgeon: Bjorn Pippin, MD;  Location: [facility];  Service: Orthopedics;  Laterality: Right;    Obstetrical History:  OB History     Gravida  4   Para  2   Term  2   Preterm      AB  1   Living  2      SAB  1   IAB      Ectopic  0   Multiple      Live Births  2           Gynecological History:  OB History     Gravida  4   Para  2   Term  2   Preterm      AB  1   Living  2      SAB  1   IAB      Ectopic  0   Multiple      Live Births  2           Social History:  Social History   Socioeconomic History   Marital status: Married    Spouse name: Not on file   Number of children: Not on file   Years of education: Not on file   Highest education level: GED or equivalent  Occupational History   Not on file  Tobacco Use   Smoking status: Former    Types: E-cigarettes   Smokeless tobacco:  Never  Vaping Use   Vaping status: Former   Substances: Nicotine   Devices: QUIT 1 YEAR AGO (only for a month)  Substance and Sexual Activity   Alcohol use: Not Currently    Comment: SOCIAL   Drug use: Never   Sexual activity: Yes    Birth control/protection: None  Other Topics Concern   Not on file  Social History Narrative   ** Merged History Encounter **       Social Determinants of Health   Financial Resource Strain: Medium Risk (12/24/2022)   Overall Financial Resource Strain (CARDIA)    Difficulty of Paying Living Expenses: Somewhat hard  Food Insecurity: Food Insecurity Present (12/24/2022)   Hunger Vital Sign    Worried About Running Out of Food in the Last Year: Sometimes true    Ran Out of Food in the Last Year: Sometimes true  Transportation Needs: No Transportation Needs (12/24/2022)   PRAPARE - Administrator, Civil Service (Medical): No    Lack of Transportation (Non-Medical): No  Physical Activity: Sufficiently Active (12/24/2022)   Exercise Vital Sign    Days of Exercise per Week: 5 days    Minutes of Exercise per Session: 60 min  Stress: No Stress Concern Present (12/24/2022)   [employer] of Occupational Health - Occupational Stress Questionnaire    Feeling of Stress : Not at all  Social Connections: Moderately Isolated (12/24/2022)   Social Connection and Isolation Panel [NHANES]    Frequency of Communication with Friends and Family: More than three times a week    Frequency of Social Gatherings with Friends and Family: More than three times a week    Attends Religious Services: Never    Database administrator or Organizations: No    Attends Engineer, structural: Not on file    Marital Status: Living with partner    Family History:  Family History  Problem Relation Age of  Onset   Healthy Mother    Healthy Father    Asthma Neg Hx    Cancer Neg Hx    Diabetes Neg Hx    Heart disease Neg Hx    Hypertension Neg Hx      Medications:  Prenatal vitamins,  Current Facility-Administered Medications  Medication Dose Route Frequency Provider Last Rate Last Admin   ceFAZolin (ANCEF) IVPB 2g/100 mL premix  2 g Intravenous On Call to OR Venora Maples, MD       lactated ringers infusion   Intravenous Continuous Venora Maples, MD 125 mL/hr at 03/08/23 1309 New Bag at 03/08/23 1309   sodium citrate-citric acid (ORACIT) solution 30 mL  30 mL Oral On Call to OR Venora Maples, MD       tranexamic acid (CYKLOKAPRON) IVPB 1,000 mg  1,000 mg Intravenous Once Venora Maples, MD        Allergies: No Known Allergies  Review of Systems: -  negative  Physical Exam: Blood pressure 122/83, pulse 62, temperature 98.3 F (36.8 C), temperature source Oral, resp. rate 16, height 5\' 4"  (1.626 m), weight 72.1 kg. GENERAL: Well-developed, well-nourished female in no acute distress.  LUNGS: Clear to auscultation bilaterally.  HEART: Regular rate and rhythm. ABDOMEN: Soft, nontender, nondistended, gravid. EFW 3.9% EXTREMITIES: Nontender, no edema, 2+ distal pulses.    Pertinent Labs/Studies:   Lab Results  Component Value Date   WBC 7.8 03/08/2023   HGB 11.4 (L) 03/08/2023   HCT 35.0 (L) 03/08/2023   MCV 86.4 03/08/2023   PLT 240 03/08/2023    Assessment : Parul Steinman Lailyn Hoffstetter is a 32 y.o. [phone] at [redacted]w[redacted]d being admitted for cesarean section secondary to fetal growth restriction, prior cesarean  Plan: The risks of cesarean section discussed with the patient included but were not limited to: bleeding which may require transfusion or reoperation; infection which may require antibiotics; injury to bowel, bladder, ureters or other surrounding organs; injury to the fetus; need for additional procedures including hysterectomy in the event of a life-threatening hemorrhage; placental abnormalities wth subsequent pregnancies, incisional problems, thromboembolic phenomenon and other postoperative/anesthesia  complications. The patient concurred with the proposed plan, giving informed written consent for the procedure.   Patient has been NPO since last night and will remain NPO for procedure.  Preoperative prophylactic Ancef ordered on call to the OR.    Levie Heritage, DO 03/08/2023, 1:54 PM

## 2023-03-08 NOTE — Op Note (Addendum)
Myrla Halsted PROCEDURE DATE: 03/08/2023  PREOPERATIVE DIAGNOSIS: Intrauterine pregnancy at  [redacted]w[redacted]d weeks gestation;  previous cesarean delivery x 2  POSTOPERATIVE DIAGNOSIS: The same  PROCEDURE: Low Transverse Cesarean Section and Bilateral Salpingectomy  SURGEON:  Dr. Candelaria Celeste  ASSISTANT: Dr. Sundra Aland    INDICATIONS: Latasha Powell is a 32 y.o. [phone] at [redacted]w[redacted]d scheduled for cesarean section secondary to  prior cesarean delivery x2, fetal growth restriction .  The risks of cesarean section discussed with the patient included but were not limited to: bleeding which may require transfusion or reoperation; infection which may require antibiotics; injury to bowel, bladder, ureters or other surrounding organs; injury to the fetus; need for additional procedures including hysterectomy in the event of a life-threatening hemorrhage; placental abnormalities wth subsequent pregnancies, incisional problems, thromboembolic phenomenon and other postoperative/anesthesia complications. The patient concurred with the proposed plan, giving informed written consent for the procedure.    FINDINGS:  Viable female infant in cephalic presentation.  Apgars 9 and 9, weight 2.640 kg.  Clear amniotic fluid.  Intact placenta, three vessel cord.  Normal uterus, fallopian tubes and ovaries bilaterally.  ANESTHESIA:    Spinal INTRAVENOUS FLUIDS:1400 ml ESTIMATED BLOOD LOSS: 778 ml URINE OUTPUT:  250 ml SPECIMENS: Placenta sent to pathology, bilateral fallopian tubes sent to pathology COMPLICATIONS: None immediate  PROCEDURE IN DETAIL:  The patient received intravenous antibiotics and had sequential compression devices applied to her lower extremities while in the preoperative area.  She was then taken to the operating room where spinal anesthesia was administered and was found to be adequate. She was then placed in a dorsal supine position with a leftward tilt, and prepped and draped  in a sterile manner.  A foley catheter was placed into her bladder and attached to constant gravity, which drained clear fluid throughout.  After an adequate timeout was performed, a Pfannenstiel skin incision was made with scalpel and carried through to the underlying layer of fascia. The fascia was incised in the midline and this incision was extended bilaterally using the Mayo scissors. Kocher clamps were applied to the superior aspect of the fascial incision and the underlying rectus muscles were dissected off bluntly. The rectus muscles were separated in the midline sharply using Mayo scissors and the peritoneum was entered bluntly. An Alexis retractor was placed to aid in visualization of the uterus.  Attention was turned to the lower uterine segment where a transverse hysterotomy was made with a scalpel and extended bilaterally bluntly. The infant was successfully delivered, and cord was clamped and cut and infant was handed over to awaiting neonatology team. Uterine massage was then administered and the placenta delivered intact with three-vessel cord. The uterus was then cleared of clot and debris.  The hysterotomy was closed with 0 Vicryl in a running locked fashion, and an imbricating layer was also placed with a 0 Vicryl. Overall, excellent hemostasis was noted. The abdomen and the pelvis were cleared of all clot and debris and the Jon Gills was removed. Hemostasis was confirmed on all surfaces.    Attention was then turned to the patient's uterus, and left fallopian tube was identified and followed out to the fimbriated end.  Ligasure device was used to cauterize and cut the mesosalpinx to proximal end of the fallopian tube, removing 6cm of tube. A similar process was carried out on the right side allowing for bilateral tubal sterilization.    Hemostasis was again confirmed on all surfaces.  The peritoneum was reapproximated using  2-0 vicryl running stitches. The fascia was then closed using 0 Vicryl  in a running fashion. The skin was closed with 4-0 vicryl. The patient tolerated the procedure well. Sponge, lap, instrument and needle counts were correct x 2. She was taken to the recovery room in stable condition.    Sundra Aland, MD 03/08/2023 5:08 PM   Attestation of Attending Supervision of Obstetric Fellow During Surgery: Surgery was performed by the Obstetric Fellow under my supervision and collaboration.  I have reviewed the Obstetric Fellow's operative report, and I agree with the documentation.  I was present and scrubbed for the entire procedure.    Candelaria Celeste, DO Attending Physician Faculty Practice, [facility]

## 2023-03-09 ENCOUNTER — Encounter (HOSPITAL_COMMUNITY): Payer: Self-pay | Admitting: Family Medicine

## 2023-03-09 ENCOUNTER — Ambulatory Visit: Payer: 59

## 2023-03-09 ENCOUNTER — Other Ambulatory Visit: Payer: Self-pay

## 2023-03-09 DIAGNOSIS — Z98891 History of uterine scar from previous surgery: Secondary | ICD-10-CM

## 2023-03-09 LAB — CBC
HCT: 29.8 % — ABNORMAL LOW (ref 36.0–46.0)
Hemoglobin: 9.9 g/dL — ABNORMAL LOW (ref 12.0–15.0)
MCH: 28.4 pg (ref 26.0–34.0)
MCHC: 33.2 g/dL (ref 30.0–36.0)
MCV: 85.6 fL (ref 80.0–100.0)
Platelets: 245 10*3/uL (ref 150–400)
RBC: 3.48 MIL/uL — ABNORMAL LOW (ref 3.87–5.11)
RDW: 13 % (ref 11.5–15.5)
WBC: 17.1 10*3/uL — ABNORMAL HIGH (ref 4.0–10.5)
nRBC: 0 % (ref 0.0–0.2)

## 2023-03-09 LAB — RPR: RPR Ser Ql: NONREACTIVE

## 2023-03-09 MED ORDER — FERROUS FUMARATE 324 (106 FE) MG PO TABS
1.0000 | ORAL_TABLET | ORAL | Status: DC
  Administered 2023-03-09 – 2023-03-11 (×2): 106 mg via ORAL
  Filled 2023-03-09 (×2): qty 1

## 2023-03-09 NOTE — Progress Notes (Signed)
Daily Post Partum Note  03/09/2023 Latasha Powell is a 32 y.o. [phone] POD#1 s/p RLTCS and BTL at [redacted]w[redacted]d.  Pregnancy c/b FGR, COVID dx 8/17 24hr/overnight events:  none  Subjective:  Meeting all PP and post op goals; no respiratory, chest s/s.   Objective:    Current Vital Signs 24h Vital Sign Ranges  T 98.9 F (37.2 C) Temp  Avg: 98.2 F (36.8 C)  Min: 97.6 F (36.4 C)  Max: 98.9 F (37.2 C)  BP 106/73 BP  Min: 106/73  Max: 125/84  HR (!) 59 Pulse  Avg: 77.5  Min: 59  Max: 86  RR 20 Resp  Avg: 16.7  Min: 15  Max: 20  SaO2 98 % Room Air SpO2  Avg: 97.2 %  Min: 95 %  Max: 99 %       24 Hour I/O Current Shift I/O  Time Ins Outs 08/20 0701 - 08/21 0700 In: 2300 [I.V.:2000] Out: 2543 [Urine:1700] 08/21 0701 - 08/21 1900 In: -  Out: 300 [Urine:300]    General: NAD, patient changing baby's diaper Abdomen: soft, nttp, mild to moderately distended. Skin:  Warm and dry.  Cardiovascular: S1, S2 normal, no murmur, rub or gallop, regular rate and rhythm Respiratory:  Clear to auscultation bilateral. Normal respiratory effort  Medications Current Facility-Administered Medications  Medication Dose Route Frequency Provider Last Rate Last Admin   acetaminophen (TYLENOL) tablet 1,000 mg  1,000 mg Oral Q6H Sundra Aland, MD   1,000 mg at 03/09/23 1105   coconut oil  1 Application Topical PRN Sundra Aland, MD       witch hazel-glycerin (TUCKS) pad 1 Application  1 Application Topical PRN Sundra Aland, MD       And   dibucaine (NUPERCAINAL) 1 % rectal ointment 1 Application  1 Application Rectal PRN Sundra Aland, MD       diphenhydrAMINE (BENADRYL) injection 12.5 mg  12.5 mg Intravenous Q4H PRN Lacy Duverney M, DO       Or   diphenhydrAMINE (BENADRYL) capsule 25 mg  25 mg Oral Q4H PRN Lacy Duverney M, DO       diphenhydrAMINE (BENADRYL) capsule 25 mg  25 mg Oral Q6H PRN Sundra Aland, MD       enoxaparin (LOVENOX) injection 40 mg  40 mg Subcutaneous  Q24H Sundra Aland, MD   40 mg at 03/09/23 0843   gabapentin (NEURONTIN) capsule 200 mg  200 mg Oral QHS Sundra Aland, MD   200 mg at 03/08/23 2255   ibuprofen (ADVIL) tablet 600 mg  600 mg Oral Q6H Sundra Aland, MD       lactated ringers infusion   Intravenous Continuous Sundra Aland, MD       magnesium hydroxide (MILK OF MAGNESIA) suspension 30 mL  30 mL Oral Q3 days PRN Sundra Aland, MD       menthol-cetylpyridinium (CEPACOL) lozenge 3 mg  1 lozenge Oral Q2H PRN Sundra Aland, MD       naloxone [facility]) injection 0.4 mg  0.4 mg Intravenous PRN Lacy Duverney M, DO       And   sodium chloride flush (NS) 0.9 % injection 3 mL  3 mL Intravenous PRN Lacy Duverney M, DO       naloxone HCl (NARCAN) 2 mg in dextrose 5 % 250 mL infusion  1-2 mcg/kg/hr Intravenous Continuous PRN Lacy Duverney M, DO       ondansetron [facility]) injection 4 mg  4 mg Intravenous Q8H PRN Lacy Duverney  M, DO       oxyCODONE (Oxy IR/ROXICODONE) immediate release tablet 5-10 mg  5-10 mg Oral Q4H PRN Sundra Aland, MD   5 mg at 03/09/23 1106   prenatal multivitamin tablet 1 tablet  1 tablet Oral Q1200 Sundra Aland, MD   1 tablet at 03/09/23 1105   scopolamine (TRANSDERM-SCOP) 1 MG/3DAYS 1.5 mg  1 patch Transdermal Once Finucane, Elizabeth M, DO       senna-docusate (Senokot-S) tablet 2 tablet  2 tablet Oral Daily Sundra Aland, MD   2 tablet at 03/09/23 1105   simethicone (MYLICON) chewable tablet 80 mg  80 mg Oral TID PC Sundra Aland, MD   80 mg at 03/09/23 1219   simethicone (MYLICON) chewable tablet 80 mg  80 mg Oral PRN Sundra Aland, MD        Labs:  Recent Labs  Lab 03/05/23 2245 03/08/23 1217 03/09/23 1052  WBC 6.8 7.8 17.1*  HGB 11.5* 11.4* 9.9*  HCT 34.9* 35.0* 29.8*  PLT 245 240 245   Recent Labs  Lab 03/05/23 2245 03/08/23 2126  NA 136 133*  K 3.9 3.8  CL 108 104  CO2 18* 19*  BUN 7 8  CREATININE 0.57 0.68  GLUCOSE 81 164*  CALCIUM 8.5* 8.5*     Assessment & Plan:  Patient doing well *Postpartum/postop: routine care O pos/rpr neg/girl/ *COVID: no issues.  *Dispo: potentially tomorrow.   Cornelia Copa MD Attending Center for [facility])

## 2023-03-09 NOTE — Progress Notes (Signed)
POSTPARTUM PROGRESS NOTE  POD #2  Subjective:  Latasha Powell is a 32 y.o. [phone] s/p repeat LTCS at [redacted]w[redacted]d. No acute events overnight. She reports she is doing well. She denies any problems with ambulating, voiding or po intake. Denies nausea or vomiting. She has not passed flatus. Pain is moderately controlled.  Lochia is minimal.  Objective: Blood pressure 104/71, pulse 72, temperature 98.1 F (36.7 C), temperature source Oral, resp. rate 16, height 5\' 4"  (1.626 m), weight 72.1 kg, SpO2 100%, unknown if currently breastfeeding.  Physical Exam:  General: alert, cooperative and no distress Chest: no respiratory distress Heart: regular rate, distal pulses intact Uterine Fundus: firm, appropriately tender DVT Evaluation: No calf swelling or tenderness Extremities: No edema Skin: warm, dry; incision clean/dry/intact w/ honeycomb dressing in place  Recent Labs    03/08/23 1217 03/09/23 1052  HGB 11.4* 9.9*  HCT 35.0* 29.8*    Assessment/Plan: Latasha Powell Latasha Powell is a 32 y.o. [id number] s/p rLTCS at [redacted]w[redacted]d for FGR.  POD#2 - Doing well; pain well controlled.  Routine postpartum care  OOB, ambulated  Lovenox for VTE prophylaxis Acute blood loss anemia: asymptomatic  Continue Fe COVID: no issues  Contraception: s/p b/l salpingectomy Feeding: breast  Dispo: Plan for discharge tomorrow.   LOS: 2 days   Sundra Aland, MD OB Fellow  03/10/2023, 7:05 AM

## 2023-03-09 NOTE — Discharge Summary (Addendum)
Postpartum Discharge Summary     Patient Name: Latasha Powell DOB: [birth year] MRN: [id number]  Date of admission: 03/08/2023 Delivery date:03/08/2023 Delivering provider: Levie Heritage Date of discharge: 03/11/2023  Admitting diagnosis: S/P cesarean section [Z98.891] History of cesarean delivery [Z98.891] Intrauterine pregnancy: [redacted]w[redacted]d     Secondary diagnosis:  Principal Problem:   S/P cesarean section Active Problems:   History of cesarean delivery  Additional problems: acute blood loss anemia    Discharge diagnosis: Term Pregnancy Delivered                                              Post partum procedures: postpartum bilateral salpingectomy Augmentation: N/A Complications: None  Hospital course: Sceduled C/S   32 y.o. yo [id number] at [redacted]w[redacted]d was admitted to the hospital 03/08/2023 for scheduled cesarean section with the following indication:Elective Repeat and FGR .Delivery details are as follows:  Membrane Rupture Time/Date: 3:50 PM,03/08/2023  Delivery Method:C-Section, Low Transverse Operative Delivery:N/A Details of operation can be found in separate operative note.  Patient had an uncomplicated postpartum course.  She is ambulating, tolerating a regular diet, passing flatus, and urinating well. Patient is discharged home in stable condition on  03/11/23        Newborn Data: Birth date:03/08/2023 Birth time:3:50 PM Gender:Female Living status:Living Apgars:9 ,9  Weight:2640 g    Magnesium Sulfate received: No BMZ received: No Rhophylac:No MMR:No T-DaP:Given prenatally Flu: N/A Transfusion:No  Physical exam  Vitals:   03/09/23 2006 03/10/23 1413 03/10/23 1930 03/11/23 0530  BP: 104/71 121/74 131/80 120/80  Pulse: 72 72 78 61  Resp: 16 20 18    Temp: 98.1 F (36.7 C) 98.6 F (37 C) 97.9 F (36.6 C) 98 F (36.7 C)  TempSrc: Oral Oral Oral Oral  SpO2: 100%  100% 98%  Weight:      Height:       General: alert, cooperative, and no  distress Lochia: appropriate Uterine Fundus: firm Incision: Healing well with no significant drainage, Dressing is clean, dry, and intact DVT Evaluation: No evidence of DVT seen on physical exam. No significant calf/ankle edema. Labs: Lab Results  Component Value Date   WBC 17.1 (H) 03/09/2023   HGB 9.9 (L) 03/09/2023   HCT 29.8 (L) 03/09/2023   MCV 85.6 03/09/2023   PLT 245 03/09/2023      Latest Ref Rng & Units 03/08/2023    9:26 PM  CMP  Glucose 70 - 99 mg/dL 638   BUN 6 - 20 mg/dL 8   Creatinine 7.56 - 4.33 mg/dL 2.95   Sodium 188 - 416 mmol/L 133   Potassium 3.5 - 5.1 mmol/L 3.8   Chloride 98 - 111 mmol/L 104   CO2 22 - 32 mmol/L 19   Calcium 8.9 - 10.3 mg/dL 8.5   Total Protein 6.5 - 8.1 g/dL 5.6   Total Bilirubin 0.3 - 1.2 mg/dL 0.5   Alkaline Phos 38 - 126 U/L 250   AST 15 - 41 U/L 40   ALT 0 - 44 U/L 22    Edinburgh Score:    03/10/2023    9:00 AM  Edinburgh Postnatal Depression Scale Screening Tool  I have been able to laugh and see the funny side of things. 0  I have looked forward with enjoyment to things. 0  I have blamed myself unnecessarily when things went  wrong. 0  I have been anxious or worried for no good reason. 0  I have felt scared or panicky for no good reason. 1  Things have been getting on top of me. 0  I have been so unhappy that I have had difficulty sleeping. 0  I have felt sad or miserable. 0  I have been so unhappy that I have been crying. 0  The thought of harming myself has occurred to me. 0  Edinburgh Postnatal Depression Scale Total 1     After visit meds:  Allergies as of 03/11/2023   No Known Allergies      Medication List     STOP taking these medications    Paxlovid (300/100) 20 x 150 MG & 10 x 100MG  Tbpk Generic drug: nirmatrelvir & ritonavir   promethazine 25 MG tablet Commonly known as: PHENERGAN       TAKE these medications    Ferrous Fumarate 324 (106 Fe) MG Tabs tablet Commonly known as: HEMOCYTE - 106  mg FE Take 1 tablet (106 mg of iron total) by mouth every other day.   gabapentin 100 MG capsule Commonly known as: NEURONTIN Take 2 capsules (200 mg total) by mouth at bedtime.   ibuprofen 600 MG tablet Commonly known as: ADVIL Take 1 tablet (600 mg total) by mouth every 6 (six) hours.   oxyCODONE 5 MG immediate release tablet Commonly known as: Oxy IR/ROXICODONE Take 1-2 tablets (5-10 mg total) by mouth every 8 (eight) hours as needed for up to 3 days for severe pain.   PrePLUS 27-1 MG Tabs Take 1 tablet by mouth daily.   senna-docusate 8.6-50 MG tablet Commonly known as: Senokot-S Take 2 tablets by mouth daily.         Discharge home in stable condition Infant Feeding: Breast Infant Disposition:home with mother Discharge instruction: per After Visit Summary and Postpartum booklet. Activity: Advance as tolerated. Pelvic rest for 6 weeks.  Diet: routine diet Future Appointments:No future appointments. Follow up Visit:  Message sent [facility] 8/21 Please schedule this patient for a In person postpartum visit in 6 weeks with the following provider: Any provider. Additional Postpartum F/U:Incision check 1 week  Low risk pregnancy complicated by:  FGR of infant Delivery mode:  C-Section, Low Transverse Anticipated Birth Control:  BTL done PP   03/11/2023 Sundra Aland, MD

## 2023-03-09 NOTE — Lactation Note (Signed)
This note was copied from a baby's chart. Lactation Consultation Note Mom speaks good enough English didn't need an interpreter at this time. Mom had good communication w/LC. Mom stated BF going well. Mom stated she is just trying to BF. Mom stated the baby BF for 40 minutes. Discussed w/mom not to BF longer than 30 minutes but as baby looses weight not to BF longer than 20 minutes because it causes them to burn calories and loose weight. Baby will loose weight until mom's milk comes in. Mom stated OK. Discussed w/mom pumping. Mom agreed to pump. Mom shown how to use DEBP & how to disassemble, clean, & reassemble parts. Pump q 3 hr. Mom pumped as LC was in rm. Mom got a glistening on flange. LC wiped flanged w/gloved finger and baby suckled off of gloved finger. Then that made baby hungry and want to BF. Lc gave mom baby and watched a latch. Latch was good. Praised mom. Encouraged to assess for milk transfer from breast. Newborn feeding habits, STS, I&O, positioning, support, supply and demand reviewed. Mom encouraged to feed baby 8-12 times/24 hours and with feeding cues.  Encouraged mom to call for assistance or questions.  Patient Name: Latasha Powell Today's Date: 03/09/2023 Age:34 hours Reason for consult: Initial assessment;Early term 37-38.6wks   Maternal Data Has patient been taught Hand Expression?: Yes  Feeding Mother's Current Feeding Choice: Breast Milk  LATCH Score Latch: Grasps breast easily, tongue down, lips flanged, rhythmical sucking.  Audible Swallowing: A few with stimulation  Type of Nipple: Everted at rest and after stimulation  Comfort (Breast/Nipple): Soft / non-tender  Hold (Positioning): Assistance needed to correctly position infant at breast and maintain latch.  LATCH Score: 8   Lactation Tools Discussed/Used Tools: Pump Breast pump type: Double-Electric Breast Pump Pump Education: Setup, frequency, and cleaning;Milk Storage Reason  for Pumping: less than 6 lbs Pumping frequency: q 3hr Pumped volume: 0 mL  Interventions Interventions: Breast feeding basics reviewed;Assisted with latch;Skin to skin;Breast massage;Hand express;Breast compression;Adjust position;Support pillows;Position options;DEBP;LC Services brochure  Discharge    Consult Status Consult Status: Follow-up Date: 03/10/23 Follow-up type: In-patient    Lakara Weiland, Diamond Nickel 03/09/2023, 3:11 AM

## 2023-03-10 LAB — SURGICAL PATHOLOGY

## 2023-03-11 ENCOUNTER — Encounter (HOSPITAL_COMMUNITY)
Admission: RE | Admit: 2023-03-11 | Discharge: 2023-03-11 | Disposition: A | Discharge status: Home / Self Care | Payer: 59 | Source: Ambulatory Visit | Attending: Obstetrics & Gynecology | Admitting: Obstetrics & Gynecology

## 2023-03-11 MED ORDER — IBUPROFEN 600 MG PO TABS: 600.0000 mg | ORAL_TABLET | Freq: Four times a day (QID) | ORAL | 0 refills | Status: DC

## 2023-03-11 MED ORDER — FERROUS FUMARATE 324 (106 FE) MG PO TABS: 1.0000 | ORAL_TABLET | ORAL | 0 refills | Status: AC

## 2023-03-11 MED ORDER — SENNOSIDES-DOCUSATE SODIUM 8.6-50 MG PO TABS: 2.0000 | ORAL_TABLET | Freq: Every day | ORAL | 0 refills | Status: DC

## 2023-03-11 MED ORDER — GABAPENTIN 100 MG PO CAPS: 200.0000 mg | ORAL_CAPSULE | Freq: Every day | ORAL | 0 refills | Status: AC

## 2023-03-11 MED ORDER — OXYCODONE HCL 5 MG PO TABS: 5.0000 mg | ORAL_TABLET | Freq: Three times a day (TID) | ORAL | 0 refills | Status: AC | PRN

## 2023-03-11 NOTE — Lactation Note (Addendum)
This note was copied from a baby's chart. Lactation Consultation Note  Patient Name: Latasha Powell [id number] Date: 03/11/2023 Age:34 hours  Reason for consult: Follow-up assessment;Nipple pain/trauma;Early term 37-38.6wks;Infant < 6lbs  P3, [redacted]w[redacted]d, 2489 g (at discharge), IUGR  Mother has sore nipples and areola edema. She reports baby is breastfeeding well and has "strong pull on my nipple". The surface of the right nipple is bruised, scabbed and painful and left nipple is tender. Both areolas are edematous and reverse pressure softening is uncomfortable for mother and tissue is not compressible for obtaining a deep latch. Attempt was made to latch baby to left breast but she was sleepy following her last formula feeding of 45 ml.   Mother was given comfort gels with instructions for use, application and duration to apply to nipple(s) to promote healing. Recommended mother not latch baby to right breast until nipple has healed and latch is not painful. Mother was instructed on the use, application and cleaning of nipple shield and 20 mm NS was applied to breast and mother returned demonstration. Infant has been latching shallow and she has mostly been bottle feeding and NS can be beneficial to transition baby to breast. Mother has experience with breastfeeding and states understanding of milk seen in the shield, listening for swallows and breast softening to validate effective feeding with NS and breastfeeding. Coconut oil given to use when not using comfort gel.  Mother has a hand pump. She reports she likes using a hand pump. Request was sent for a stork pump, pending.   With mother's consent, referral was made to [facility] for an OP lactation appointment.   Feeding Mother's Current Feeding Choice: Breast Milk and Formula  LATCH Score  Baby sleepy, not observed   Lactation Tools Discussed/Used Tools: Pump;Flanges;Nipple Shields;Coconut oil Nipple shield size:  20 Flange Size: 24  Interventions Interventions: Breast feeding basics reviewed;Hand express;Support pillows;Position options;Coconut oil;Comfort gels;Education  Discharge Discharge Education: Engorgement and breast care;Warning signs for feeding baby;Outpatient recommendation;Outpatient Epic message sent Pump: Manual (submitted for stork pump)  Consult Status Consult Status: Complete Date: 03/11/23    Omar Person 03/11/2023, 11:10 AM

## 2023-03-14 DIAGNOSIS — O34219 Maternal care for unspecified type scar from previous cesarean delivery: Secondary | ICD-10-CM

## 2023-03-15 ENCOUNTER — Encounter: Payer: Self-pay | Admitting: Obstetrics and Gynecology

## 2023-03-18 ENCOUNTER — Other Ambulatory Visit: Payer: Self-pay

## 2023-03-18 ENCOUNTER — Encounter: Payer: Self-pay | Admitting: General Practice

## 2023-03-18 ENCOUNTER — Ambulatory Visit (INDEPENDENT_AMBULATORY_CARE_PROVIDER_SITE_OTHER): Payer: 59 | Admitting: General Practice

## 2023-03-18 VITALS — BP 119/82 | HR 91 | Ht 64.0 in | Wt 144.0 lb

## 2023-03-18 DIAGNOSIS — Z5189 Encounter for other specified aftercare: Secondary | ICD-10-CM | POA: Diagnosis not present

## 2023-03-18 DIAGNOSIS — R300 Dysuria: Secondary | ICD-10-CM | POA: Diagnosis not present

## 2023-03-18 DIAGNOSIS — G8918 Other acute postprocedural pain: Secondary | ICD-10-CM

## 2023-03-18 LAB — POCT URINALYSIS DIP (DEVICE)
Bilirubin Urine: NEGATIVE
Glucose, UA: NEGATIVE mg/dL
Ketones, ur: NEGATIVE mg/dL
Leukocytes,Ua: NEGATIVE
Nitrite: NEGATIVE
Protein, ur: NEGATIVE mg/dL
Specific Gravity, Urine: >=1.03 (ref 1.005–1.030)
Urobilinogen, UA: 1 mg/dL (ref 0.0–1.0)
pH: 6 (ref 5.0–8.0)

## 2023-03-18 MED ORDER — PHENAZOPYRIDINE HCL 200 MG PO TABS
200.0000 mg | ORAL_TABLET | Freq: Two times a day (BID) | ORAL | 0 refills | Status: AC | PRN
Start: 2023-03-18 — End: 2023-03-21

## 2023-03-18 MED ORDER — IBUPROFEN 600 MG PO TABS: 600.0000 mg | ORAL_TABLET | Freq: Four times a day (QID) | ORAL | 0 refills | Status: AC

## 2023-03-18 NOTE — Progress Notes (Signed)
Patient presents to office today for wound check following repeat c-section on 8/20. She reports constant RLQ pain from incision that radiates down the leg that feels like a stabbing pain. Her pain has slowly improved since delivery but is still significant & increases with activity. She ran out of Ibuprofen so she hasn't had it in a couple of days. Has Rx for gabapentin but wasn't sure if she could take it or what it was for. She also reports dysuria that starts midstream since delivery and removal of catheter- denies incomplete emptying or frequency. She also is spotting and reports small blood clots the size of a golf ball when urinating. She pees about 4-5 times a day. Honeycomb dressing removed & patient reports a decrease in pain after that. Incision is clean, dry & intact and appears to be healing well. Wound care and signs of infection reviewed with patient. Ibuprofen Rx refilled and discussed using that and gabapentin as needed for pain- advised to call us next week if she wasn't seeing improvement. UA is unremarkable- discussed with Dr Alysia Penna who ordered urine culture & pyridium BID x3 days prn. Also discussed with patient. Recommended she increase hydration and void more often. Patient verbalized understanding to all and had no additional questions/concerns.   Chase Caller RN BSN 03/18/23

## 2023-03-20 LAB — URINE CULTURE: Organism ID, Bacteria: NO GROWTH

## 2023-03-24 ENCOUNTER — Encounter: Payer: Self-pay | Admitting: Obstetrics and Gynecology

## 2023-04-19 ENCOUNTER — Ambulatory Visit: Payer: 59 | Admitting: Certified Nurse Midwife

## 2023-04-19 DIAGNOSIS — Z3009 Encounter for other general counseling and advice on contraception: Secondary | ICD-10-CM

## 2023-04-19 DIAGNOSIS — Z98891 History of uterine scar from previous surgery: Secondary | ICD-10-CM

## 2023-04-19 DIAGNOSIS — Z603 Acculturation difficulty: Secondary | ICD-10-CM

## 2024-01-17 ENCOUNTER — Ambulatory Visit (INDEPENDENT_AMBULATORY_CARE_PROVIDER_SITE_OTHER): Admitting: Obstetrics and Gynecology

## 2024-01-17 ENCOUNTER — Other Ambulatory Visit (HOSPITAL_COMMUNITY)
Admission: RE | Admit: 2024-01-17 | Discharge: 2024-01-17 | Disposition: A | Discharge status: Home / Self Care | Source: Ambulatory Visit | Attending: Obstetrics and Gynecology | Admitting: Obstetrics and Gynecology

## 2024-01-17 ENCOUNTER — Encounter: Payer: Self-pay | Admitting: Obstetrics and Gynecology

## 2024-01-17 VITALS — BP 124/78 | HR 95 | Ht 61.25 in | Wt 155.0 lb

## 2024-01-17 DIAGNOSIS — Z23 Encounter for immunization: Secondary | ICD-10-CM | POA: Diagnosis not present

## 2024-01-17 DIAGNOSIS — Z01419 Encounter for gynecological examination (general) (routine) without abnormal findings: Secondary | ICD-10-CM

## 2024-01-17 DIAGNOSIS — Z1331 Encounter for screening for depression: Secondary | ICD-10-CM

## 2024-01-17 NOTE — Progress Notes (Unsigned)
 33 y.o. y.o. female here for annual exam. Patient's last menstrual period was 12/29/2023 (approximate). Period Duration (Days): 3 Period Pattern: Regular Menstrual Flow: Moderate Dysmenorrhea: (!) Moderate Dysmenorrhea Symptoms: Headache  S/p ltcs 10 months ago with tubal ligation  Gardesil: has not completed. First shot today. Declines sti testing Would like to do annual labs here today  Body mass index is 29.05 kg/m.     01/17/2024    2:53 PM 12/07/2022   10:38 AM  Depression screen PHQ 2/9  Decreased Interest 1 2  Down, Depressed, Hopeless 0 0  PHQ - 2 Score 1 2  Altered sleeping  1  Tired, decreased energy  3  Change in appetite  1  Feeling bad or failure about yourself   0  Trouble concentrating  0  Moving slowly or fidgety/restless  0  Suicidal thoughts  0  PHQ-9 Score  7  Difficult doing work/chores  Not difficult at all    Blood pressure 124/78, pulse 95, height 5' 1.25 (1.556 m), weight 155 lb (70.3 kg), last menstrual period 12/29/2023, SpO2 98%, currently breastfeeding.     Component Value Date/Time   DIAGPAP  09/15/2021 1536    - Negative for intraepithelial lesion or malignancy (NILM)   HPVHIGH Negative 09/15/2021 1536   ADEQPAP  09/15/2021 1536    Satisfactory for evaluation; transformation zone component PRESENT.    GYN HISTORY:    Component Value Date/Time   DIAGPAP  09/15/2021 1536    - Negative for intraepithelial lesion or malignancy (NILM)   HPVHIGH Negative 09/15/2021 1536   ADEQPAP  09/15/2021 1536    Satisfactory for evaluation; transformation zone component PRESENT.    OB History  Gravida Para Term Preterm AB Living  4 3 3  1 3   SAB IAB Ectopic Multiple Live Births  1  0 0 3    # Outcome Date GA Lbr Len/2nd Weight Sex Type Anes PTL Lv  4 Term 03/08/23 [redacted]w[redacted]d  5 lb 13.1 oz (2.64 kg) F CS-LTranv Spinal  LIV  3 SAB 01/2022          2 Term 10/30/11 [redacted]w[redacted]d   F CS-LTranv   LIV  1 Term 10/31/06 [redacted]w[redacted]d   F CS-LTranv   LIV    Past  Medical History:  Diagnosis Date   Medical history non-contributory    Open fracture of right elbow 10/25/2019   Pregnancy headache in third trimester 01/13/2023   Supervision of other normal pregnancy, antepartum 11/30/2022              NURSING     PROVIDER      Office Location    [facility]    Dating by    U/S at 7 wks      [facility] Model    Traditional    Anatomy U/S           Initiated care at     [facility]     English but may need spanish interpreter assist.                      LAB RESULTS       Support Person    [place] FOB    Genetics    NIPS:  low risk  girl  AFP: late t    Past Surgical History:  Procedure Laterality Date   APPENDECTOMY     CAPSULAR RELEASE Right 03/20/2020   Procedure: RIGHT ELBOW ARTHROTOMY W/CAPSULAR RELEASE;  Surgeon: Cristy Bonner DASEN, MD;  Location: [facility];  Service: Orthopedics;  Laterality: Right;   CESAREAN SECTION     CESAREAN SECTION     X2   CESAREAN SECTION WITH BILATERAL TUBAL LIGATION N/A 03/08/2023   Procedure: REPEAT CESAREAN SECTION WITH BILATERAL TUBAL LIGATION VERTICAL  RLTC;  Surgeon: Barbra Lang PARAS, DO;  Location: [facility];  Service: Obstetrics;  Laterality: N/A;   I & D EXTREMITY Right 10/25/2019   Procedure: Right elbow joint incision debridement with excisional debridement of bone and tendon as well as subcutaneous tissue.  Right deep wound incision debridement with excisional debridement of skin ,muscle, and fascia. ;  Surgeon: Cristy Bonner DASEN, MD;  Location: [facility] OR;  Service: Orthopedics;  Laterality: Right;   I & D EXTREMITY Right 10/29/2019   Procedure: IRRIGATION AND DEBRIDEMENT EXTREMITY;  Surgeon: Cristy Bonner DASEN, MD;  Location: [facility] OR;  Service: Orthopedics;  Laterality: Right;   TRICEPS TENDON REPAIR Right 10/29/2019   Procedure: TRICEPS TENDON REPAIR;  Surgeon: Cristy Bonner DASEN, MD;  Location: [facility] OR;  Service: Orthopedics;  Laterality: Right;   ULNAR NERVE TRANSPOSITION Right 03/20/2020    Procedure: NEUROPLASTY AND TRANSPOSITION ULNAR NERVE AT ELBOW;  Surgeon: Cristy Bonner DASEN, MD;  Location:  SURGERY CENTER;  Service: Orthopedics;  Laterality: Right;    Current Outpatient Medications on File Prior to Visit  Medication Sig Dispense Refill   Ferrous Fumarate  (HEMOCYTE - 106 MG FE) 324 (106 Fe) MG TABS tablet Take 1 tablet (106 mg of iron total) by mouth every other day. (Patient not taking: Reported on 01/17/2024) 30 tablet 0   gabapentin  (NEURONTIN ) 100 MG capsule Take 2 capsules (200 mg total) by mouth at bedtime. (Patient not taking: Reported on 01/17/2024) 30 capsule 0   ibuprofen  (ADVIL ) 600 MG tablet Take 1 tablet (600 mg total) by mouth every 6 (six) hours. (Patient not taking: Reported on 01/17/2024) 30 tablet 0   Prenatal Vit-Fe Fumarate-FA (PREPLUS) 27-1 MG TABS Take 1 tablet by mouth daily. (Patient not taking: Reported on 01/17/2024) 30 tablet 13   No current facility-administered medications on file prior to visit.    Social History   Socioeconomic History   Marital status: Married    Spouse name: Not on file   Number of children: Not on file   Years of education: Not on file   Highest education level: GED or equivalent  Occupational History   Not on file  Tobacco Use   Smoking status: Former    Types: E-cigarettes   Smokeless tobacco: Never  Vaping Use   Vaping status: Former   Substances: Nicotine   Devices: QUIT 1 YEAR AGO (only for a month)  Substance and Sexual Activity   Alcohol use: Not Currently    Comment: SOCIAL   Drug use: Never   Sexual activity: Yes    Birth control/protection: None  Other Topics Concern   Not on file  Social History Narrative   ** Merged History Encounter **       Social Drivers of Health   Financial Resource Strain: Medium Risk (12/24/2022)   Overall Financial Resource Strain (CARDIA)    Difficulty of Paying Living Expenses: Somewhat hard  Food Insecurity: Food Insecurity Present (03/09/2023)   Hunger Vital Sign  Worried About Programme researcher, broadcasting/film/video in the Last Year: Sometimes true    [employer] of Food in the Last Year: Sometimes true  Transportation Needs: No Transportation Needs (03/09/2023)   PRAPARE - Administrator, Civil Service (Medical): No    Lack of Transportation (Non-Medical): No  Physical Activity: Sufficiently Active (12/24/2022)   Exercise Vital Sign    Days of Exercise per Week: 5 days    Minutes of Exercise per Session: 60 min  Stress: No Stress Concern Present (12/24/2022)   [employer] of Occupational Health - Occupational Stress Questionnaire    Feeling of Stress : Not at all  Social Connections: Moderately Isolated (12/24/2022)   Social Connection and Isolation Panel    Frequency of Communication with Friends and Family: More than three times a week    Frequency of Social Gatherings with Friends and Family: More than three times a week    Attends Religious Services: Never    Database administrator or Organizations: No    Attends Engineer, structural: Not on file    Marital Status: Living with partner  Intimate Partner Violence: Not At Risk (03/09/2023)   Humiliation, Afraid, Rape, and Kick questionnaire    Fear of Current or Ex-Partner: No    Emotionally Abused: No    Physically Abused: No    Sexually Abused: No    Family History  Problem Relation Age of Onset   Healthy Mother    Healthy Father    Asthma Neg Hx    Cancer Neg Hx    Diabetes Neg Hx    Heart disease Neg Hx    Hypertension Neg Hx      No Known Allergies    Patient's last menstrual period was Patient's last menstrual period was 12/29/2023 (approximate)..            Review of Systems Alls systems reviewed and are negative.     Physical Exam Constitutional:      Appearance: Normal appearance.  Genitourinary:     Vulva and urethral meatus normal.     No lesions in the vagina.     Right Labia: No rash, lesions or skin changes.    Left Labia: No lesions, skin changes or  rash.    No vaginal discharge or tenderness.     No vaginal prolapse present.    No vaginal atrophy present.     Right Adnexa: not tender, not palpable and no mass present.    Left Adnexa: not tender, not palpable and no mass present.    No cervical motion tenderness or discharge.     Uterus is not enlarged, tender or irregular.  Breasts:    Right: Normal.     Left: Normal.  HENT:     Head: Normocephalic.  Neck:     Thyroid: No thyroid mass, thyromegaly or thyroid tenderness.   Cardiovascular:     Rate and Rhythm: Normal rate and regular rhythm.     Heart sounds: Normal heart sounds, S1 normal and S2 normal.  Pulmonary:     Effort: Pulmonary effort is normal.     Breath sounds: Normal breath sounds and air entry.  Abdominal:     General: There is no distension.     Palpations: Abdomen is soft. There is no mass.     Tenderness: There is no abdominal tenderness. There is no guarding or rebound.   Musculoskeletal:        General: Normal range of  motion.     Cervical back: Full passive range of motion without pain, normal range of motion and neck supple. No tenderness.     Right lower leg: No edema.     Left lower leg: No edema.   Neurological:     Mental Status: She is alert.   Skin:    General: Skin is warm.   Psychiatric:        Mood and Affect: Mood normal.        Behavior: Behavior normal.        Thought Content: Thought content normal.  Vitals and nursing note reviewed. Exam conducted with a chaperone present.       A:         Well Woman GYN exam, recent LTCS with BTL 10 months ago                             P:        Pap smear collected today Encouraged annual mammogram screening Labs and immunizations ordered today Discussed breast self exams Encouraged healthy lifestyle practices Gardesil#1 given. Counseled on the importance of the vaccine to reduce risk for cervical cancer and brochure was given to her in [place].  She agreed and wanted to receive the  vaccine today.   No follow-ups on file.  Latasha Powell

## 2024-01-18 ENCOUNTER — Ambulatory Visit
Admission: EM | Admit: 2024-01-18 | Discharge: 2024-01-18 | Disposition: A | Discharge status: Home / Self Care | Source: Ambulatory Visit | Attending: Physician Assistant | Admitting: Physician Assistant

## 2024-01-18 DIAGNOSIS — R42 Dizziness and giddiness: Secondary | ICD-10-CM

## 2024-01-18 DIAGNOSIS — K219 Gastro-esophageal reflux disease without esophagitis: Secondary | ICD-10-CM | POA: Diagnosis not present

## 2024-01-18 DIAGNOSIS — R519 Headache, unspecified: Secondary | ICD-10-CM | POA: Diagnosis not present

## 2024-01-18 MED ORDER — FAMOTIDINE 20 MG PO TABS: 20.0000 mg | ORAL_TABLET | Freq: Every day | ORAL | 1 refills | Status: AC

## 2024-01-18 MED ORDER — ONDANSETRON 4 MG PO TBDP: 4.0000 mg | ORAL_TABLET | Freq: Three times a day (TID) | ORAL | 0 refills | Status: AC | PRN

## 2024-01-18 NOTE — Discharge Instructions (Addendum)
 You were seen today for concerns of dizziness, headaches, nausea and fullness. I suspect that your nausea and stomach fullness are likely secondary to acid reflux. To help with this I am sending in a medication called famotidine  for you to take once per day for the next month.  This should help reduce the fullness as well as some of the nausea symptoms.  I am also sending in a medication called Zofran  to help with your nausea  I have also sent in a medication called Zofran  to assist with your nausea and vomiting.  You can take this as needed up to every 8 hours.  Please be advised that the most common side effect of this medication is constipation.  If you start to develop the symptoms I recommend taking a few doses of a stool softener and increasing your fiber.  If needed you can also take a few doses of MiraLAX  until you have regular bowel movements again.  The headaches and dizziness  can have several causes so I have  included several information sheets for you to review in your paperwork.  We have collected blood work to rule out anemia as well as electrolyte imbalance and we will keep you updated with those results once they are available on MyChart. To help with management I recommend that you stay well-hydrated with at least 75 ounces of water  per day make sure that you are eating regularly to help prevent low blood sugars. To help with the dizziness please try doing the Epley maneuver as well as adding a nasal spray such as as Flonase or azelastine as you may having some eustachian tube dysfunction that is causing your dizziness If your symptoms are not improving or responding to the suggestions I recommend following up with your OB/GYN or establishing with a PCP for further evaluation and ongoing management

## 2024-01-18 NOTE — ED Triage Notes (Addendum)
 Pt states per interpretor [phone] that she would like to have lab work and a physical  because she hasn't been feeling good for the past few months. States she has been dizzy and having headaches. States she has been taking Advil  at home with no relief.

## 2024-01-18 NOTE — ED Provider Notes (Signed)
 [facility]    CSN: [id number] Arrival date & time: 01/18/24  1234      History   Chief Complaint Chief Complaint  Patient presents with   Dizziness    HPI Latasha Powell is a 33 y.o. female.   HPI  Spanish interpretor utilized today: Rosa # [id number] Pt is here for concerns of not feeling well for some time  She states something similar happened about 5-6 months ago   She states she is having headaches, fatigue, dizziness, nausea, sometimes she feels full even when she has not eaten anything   She does not have a PCP and has not discussed this yet She was seen at OB/GYN yesterday and annual labs were ordered but not drawn. She was not aware of labs being ordered for her at that apt.   She reports that her dizziness gets worse with position changes and walking She reports that there are times when she is walking that she feels like she will faint She also reports concerns for head pressure and nausea She reports all her symptoms usually resolve with rest or laying down  other than the headache She states even when she wakes up from her rest she will still have the headache She is eating 3 meals per day with snacks in between. She has  a pretty set schedule She is drinking water  or juice most of the day.    She reports that her stomach and nausea does not improve with eating but does happen when she does not eat anything She reports that she has drunk sparkling water  and that seems to help with her stomach symptoms   Past Medical History:  Diagnosis Date   Medical history non-contributory    Open fracture of right elbow 10/25/2019   Pregnancy headache in third trimester 01/13/2023   Supervision of other normal pregnancy, antepartum 11/30/2022              NURSING     PROVIDER      Office Location    [facility]    Dating by    U/S at 7 wks      [facility] Model    Traditional    Anatomy U/S           Initiated care at     [facility]     English but may need spanish interpreter assist.                      LAB RESULTS       Support Person    Javier FOB    Genetics    NIPS:  low risk  girl         AFP: late t    Patient Active Problem List   Diagnosis Date Noted   History of cesarean delivery 03/09/2023   IUGR (intrauterine growth restriction) affecting care of mother 01/05/2023   Late prenatal care affecting pregnancy in second trimester 11/08/2022   Language barrier 11/08/2022   S/P cesarean section 11/08/2022    Past Surgical History:  Procedure Laterality Date   APPENDECTOMY     CAPSULAR RELEASE Right 03/20/2020   Procedure: RIGHT ELBOW ARTHROTOMY W/CAPSULAR RELEASE;  Surgeon: Cristy Bonner DASEN, MD;  Location: [facility];  Service: Orthopedics;  Laterality: Right;  CESAREAN SECTION     CESAREAN SECTION     X2   CESAREAN SECTION WITH BILATERAL TUBAL LIGATION N/A 03/08/2023   Procedure: REPEAT CESAREAN SECTION WITH BILATERAL TUBAL LIGATION VERTICAL  RLTC;  Surgeon: Barbra Lang PARAS, DO;  Location: [facility];  Service: Obstetrics;  Laterality: N/A;   I & D EXTREMITY Right 10/25/2019   Procedure: Right elbow joint incision debridement with excisional debridement of bone and tendon as well as subcutaneous tissue.  Right deep wound incision debridement with excisional debridement of skin ,muscle, and fascia. ;  Surgeon: Cristy Bonner DASEN, MD;  Location: [facility];  Service: Orthopedics;  Laterality: Right;   I & D EXTREMITY Right 10/29/2019   Procedure: IRRIGATION AND DEBRIDEMENT EXTREMITY;  Surgeon: Cristy Bonner DASEN, MD;  Location: [facility];  Service: Orthopedics;  Laterality: Right;   TRICEPS TENDON REPAIR Right 10/29/2019   Procedure: TRICEPS TENDON REPAIR;  Surgeon: Cristy Bonner DASEN, MD;  Location: [facility];  Service: Orthopedics;  Laterality: Right;   ULNAR NERVE TRANSPOSITION Right 03/20/2020   Procedure: NEUROPLASTY AND TRANSPOSITION ULNAR NERVE AT ELBOW;  Surgeon: Cristy Bonner DASEN, MD;  Location: [facility];  Service: Orthopedics;  Laterality: Right;    OB History     Gravida  4   Para  3   Term  3   Preterm      AB  1   Living  3      SAB  1   IAB      Ectopic  0   Multiple  0   Live Births  3            Home Medications    Prior to Admission medications   Medication Sig Start Date End Date Taking? Authorizing Provider  famotidine  (PEPCID ) 20 MG tablet Take 1 tablet (20 mg total) by mouth daily. 01/18/24  Yes Cornesha Radziewicz E, PA-C  ondansetron  (ZOFRAN -ODT) 4 MG disintegrating tablet Take 1 tablet (4 mg total) by mouth every 8 (eight) hours as needed for nausea or vomiting. 01/18/24  Yes Kye Silverstein E, PA-C  Ferrous Fumarate  (HEMOCYTE - 106 MG FE) 324 (106 Fe) MG TABS tablet Take 1 tablet (106 mg of iron total) by mouth every other day. Patient not taking: Reported on 01/17/2024 03/11/23   Kumar, Agnijita, MD  gabapentin  (NEURONTIN ) 100 MG capsule Take 2 capsules (200 mg total) by mouth at bedtime. Patient not taking: Reported on 01/17/2024 03/11/23   Kumar, Agnijita, MD  ibuprofen  (ADVIL ) 600 MG tablet Take 1 tablet (600 mg total) by mouth every 6 (six) hours. Patient not taking: Reported on 01/17/2024 03/18/23   Ervin, Michael L, MD  Prenatal Vit-Fe Fumarate-FA (PREPLUS) 27-1 MG TABS Take 1 tablet by mouth daily. Patient not taking: Reported on 01/17/2024 08/01/22   Letha Renshaw, CNM    Family History Family History  Problem Relation Age of Onset   Healthy Mother    Healthy Father    Asthma Neg Hx    Cancer Neg Hx    Diabetes Neg Hx    Heart disease Neg Hx    Hypertension Neg Hx     Social History Social History   Tobacco Use   Smoking status: Former    Types: E-cigarettes   Smokeless tobacco: Never  Vaping Use   Vaping status: Former   Substances: Nicotine   Devices: QUIT 1 YEAR AGO (only for a month)  Substance Use Topics   Alcohol use: Not Currently  Comment: SOCIAL   Drug use: Never     Allergies   Patient has no known  allergies.   Review of Systems Review of Systems  Gastrointestinal:  Positive for nausea. Negative for vomiting.       Abdominal fullness   Neurological:  Positive for dizziness, light-headedness and headaches.     Physical Exam Triage Vital Signs ED Triage Vitals  Encounter Vitals Group     BP 01/18/24 1256 112/77     Girls Systolic BP Percentile --      Girls Diastolic BP Percentile --      Boys Systolic BP Percentile --      Boys Diastolic BP Percentile --      Pulse Rate 01/18/24 1256 72     Resp 01/18/24 1256 16     Temp 01/18/24 1256 97.9 F (36.6 C)     Temp Source 01/18/24 1256 Oral     SpO2 01/18/24 1256 99 %     Weight --      Height --      Head Circumference --      Peak Flow --      Pain Score 01/18/24 1254 7     Pain Loc --      Pain Education --      Exclude from Growth Chart --    No data found.  Updated Vital Signs BP 112/77 (BP Location: Right Arm)   Pulse 72   Temp 97.9 F (36.6 C) (Oral)   Resp 16   LMP 12/29/2023 (Approximate)   SpO2 99%   Breastfeeding No   Visual Acuity Right Eye Distance:   Left Eye Distance:   Bilateral Distance:    Right Eye Near:   Left Eye Near:    Bilateral Near:     Physical Exam Vitals reviewed.  Constitutional:      General: She is awake.     Appearance: Normal appearance. She is well-developed and well-groomed.  HENT:     Head: Normocephalic and atraumatic.     Right Ear: Hearing, tympanic membrane and ear canal normal.     Left Ear: Hearing, tympanic membrane and ear canal normal.  Eyes:     General: Lids are normal. Gaze aligned appropriately.     Extraocular Movements: Extraocular movements intact.     Conjunctiva/sclera: Conjunctivae normal.     Pupils: Pupils are equal, round, and reactive to light.  Cardiovascular:     Rate and Rhythm: Normal rate and regular rhythm.     Pulses: Normal pulses.          Radial pulses are 2+ on the right side and 2+ on the left side.     Heart sounds:  Normal heart sounds. No murmur heard.    No friction rub. No gallop.  Pulmonary:     Effort: Pulmonary effort is normal.     Breath sounds: Normal breath sounds. No decreased air movement. No decreased breath sounds, wheezing, rhonchi or rales.  Musculoskeletal:     Cervical back: Normal range of motion.     Right lower leg: No edema.     Left lower leg: No edema.  Skin:    General: Skin is warm and dry.     Findings: No rash.  Neurological:     General: No focal deficit present.     Mental Status: She is alert and oriented to person, place, and time.     GCS: GCS eye subscore is 4. GCS verbal subscore  is 5. GCS motor subscore is 6.     Cranial Nerves: No cranial nerve deficit, dysarthria or facial asymmetry.  Psychiatric:        Attention and Perception: Attention and perception normal.        Mood and Affect: Mood and affect normal.        Speech: Speech normal.        Behavior: Behavior normal. Behavior is cooperative.        Thought Content: Thought content normal.      UC Treatments / Results  Labs (all labs ordered are listed, but only abnormal results are displayed) Labs Reviewed  CBC WITH DIFFERENTIAL/PLATELET  COMPREHENSIVE METABOLIC PANEL WITH GFR    EKG   Radiology No results found.  Procedures Procedures (including critical care time)  Medications Ordered in UC Medications - No data to display  Initial Impression / Assessment and Plan / UC Course  I have reviewed the triage vital signs and the nursing notes.  Pertinent labs & imaging results that were available during my care of the patient were reviewed by me and considered in my medical decision making (see chart for details).      Final Clinical Impressions(s) / UC Diagnoses   Final diagnoses:  Dizziness  Intractable headache, unspecified chronicity pattern, unspecified headache type  Gastroesophageal reflux disease, unspecified whether esophagitis present   Patient presents today with  concerns for persistent headache, dizziness, nausea and abdominal fullness has been ongoing for the past few months.  Patient reports that dizziness seems to worsen with position changes and walking.  Physical exam does not show signs of middle ear effusion, bulging, retraction of the tympanic membrane somewhat suspicious for potential eustachian tube dysfunction as source of any ear symptoms but I do recommend trying Epley maneuver and adding Flonase nasal spray just in case this may be a contributing factor.  Will get CBC, CMP to rule out anemia or electrolyte imbalance/dehydration.  I reviewed with patient that her OB/GYN ordered routine annual labs and recommend follow-up with them for further evaluation of symptoms and further lab testing.  To assist with nausea we will send Zofran  4 mg ODT p.o. as needed.  Recommend eating regularly throughout the day and drinking at least 75 ounces of water  to help prevent hypoglycemia or dehydration/low volume which may be contributing to dizziness symptoms.  Patient's abdominal fullness and nausea appears consistent with potential GERD so I recommend starting Pepcid  for prevention as well as Pepto for acute flares.  Recommend close follow-up with PCP or OB/GYN for ongoing management as appropriate.  Follow-up as needed for progressing or persistent symptoms    Discharge Instructions      You were seen today for concerns of dizziness, headaches, nausea and fullness. I suspect that your nausea and stomach fullness are likely secondary to acid reflux. To help with this I am sending in a medication called famotidine  for you to take once per day for the next month.  This should help reduce the fullness as well as some of the nausea symptoms.  I am also sending in a medication called Zofran  to help with your nausea  I have also sent in a medication called Zofran  to assist with your nausea and vomiting.  You can take this as needed up to every 8 hours.  Please be advised  that the most common side effect of this medication is constipation.  If you start to develop the symptoms I recommend taking a few doses of  a stool softener and increasing your fiber.  If needed you can also take a few doses of MiraLAX  until you have regular bowel movements again.  The headaches and dizziness  can have several causes so I have  included several information sheets for you to review in your paperwork.  We have collected blood work to rule out anemia as well as electrolyte imbalance and we will keep you updated with those results once they are available on MyChart. To help with management I recommend that you stay well-hydrated with at least 75 ounces of water  per day make sure that you are eating regularly to help prevent low blood sugars. To help with the dizziness please try doing the Epley maneuver as well as adding a nasal spray such as as Flonase or azelastine as you may having some eustachian tube dysfunction that is causing your dizziness If your symptoms are not improving or responding to the suggestions I recommend following up with your OB/GYN or establishing with a PCP for further evaluation and ongoing management     ED Prescriptions     Medication Sig Dispense Auth. Provider   famotidine  (PEPCID ) 20 MG tablet Take 1 tablet (20 mg total) by mouth daily. 30 tablet Ethanjames Fontenot E, PA-C   ondansetron  (ZOFRAN -ODT) 4 MG disintegrating tablet Take 1 tablet (4 mg total) by mouth every 8 (eight) hours as needed for nausea or vomiting. 20 tablet Jamirah Zelaya E, PA-C      PDMP not reviewed this encounter.   Marylene Rocky FORBES DEVONNA 01/18/24 2009

## 2024-01-19 ENCOUNTER — Ambulatory Visit (HOSPITAL_COMMUNITY): Payer: Self-pay

## 2024-01-19 LAB — CBC WITH DIFFERENTIAL/PLATELET
Basophils Absolute: 0 10*3/uL (ref 0.0–0.2)
Basos: 0 %
EOS (ABSOLUTE): 0 10*3/uL (ref 0.0–0.4)
Eos: 1 %
Hematocrit: 41.1 % (ref 34.0–46.6)
Hemoglobin: 13.5 g/dL (ref 11.1–15.9)
Immature Grans (Abs): 0 10*3/uL (ref 0.0–0.1)
Immature Granulocytes: 0 %
Lymphocytes Absolute: 2.4 10*3/uL (ref 0.7–3.1)
Lymphs: 43 %
MCH: 29.5 pg (ref 26.6–33.0)
MCHC: 32.8 g/dL (ref 31.5–35.7)
MCV: 90 fL (ref 79–97)
Monocytes Absolute: 0.3 10*3/uL (ref 0.1–0.9)
Monocytes: 6 %
Neutrophils Absolute: 2.8 10*3/uL (ref 1.4–7.0)
Neutrophils: 50 %
Platelets: 321 10*3/uL (ref 150–450)
RBC: 4.57 x10E6/uL (ref 3.77–5.28)
RDW: 13 % (ref 11.7–15.4)
WBC: 5.5 10*3/uL (ref 3.4–10.8)

## 2024-01-19 LAB — COMPREHENSIVE METABOLIC PANEL WITH GFR
ALT: 20 IU/L (ref 0–32)
AST: 19 IU/L (ref 0–40)
Albumin: 4.5 g/dL (ref 3.9–4.9)
Alkaline Phosphatase: 97 IU/L (ref 44–121)
BUN/Creatinine Ratio: 18 (ref 9–23)
BUN: 10 mg/dL (ref 6–20)
Bilirubin Total: 0.3 mg/dL (ref 0.0–1.2)
CO2: 20 mmol/L (ref 20–29)
Calcium: 9.1 mg/dL (ref 8.7–10.2)
Chloride: 105 mmol/L (ref 96–106)
Creatinine, Ser: 0.57 mg/dL (ref 0.57–1.00)
Globulin, Total: 2.5 g/dL (ref 1.5–4.5)
Glucose: 79 mg/dL (ref 70–99)
Potassium: 4.3 mmol/L (ref 3.5–5.2)
Sodium: 139 mmol/L (ref 134–144)
Total Protein: 7 g/dL (ref 6.0–8.5)
eGFR: 122 mL/min/{1.73_m2} (ref >59)

## 2024-01-27 LAB — CYTOLOGY - PAP
Adequacy: ABSENT
Comment: NEGATIVE
Diagnosis: NEGATIVE
High risk HPV: NEGATIVE

## 2024-01-30 ENCOUNTER — Ambulatory Visit: Payer: Self-pay | Admitting: Obstetrics and Gynecology

## 2024-02-16 ENCOUNTER — Encounter

## 2024-03-20 ENCOUNTER — Ambulatory Visit

## 2024-04-18 ENCOUNTER — Ambulatory Visit

## 2024-05-09 ENCOUNTER — Encounter (HOSPITAL_BASED_OUTPATIENT_CLINIC_OR_DEPARTMENT_OTHER): Payer: Self-pay

## 2024-05-09 ENCOUNTER — Other Ambulatory Visit: Payer: Self-pay

## 2024-05-09 ENCOUNTER — Emergency Department (HOSPITAL_BASED_OUTPATIENT_CLINIC_OR_DEPARTMENT_OTHER)

## 2024-05-09 ENCOUNTER — Emergency Department (HOSPITAL_BASED_OUTPATIENT_CLINIC_OR_DEPARTMENT_OTHER)
Admission: EM | Admit: 2024-05-09 | Discharge: 2024-05-09 | Disposition: A | Discharge status: Home / Self Care | Attending: Emergency Medicine | Admitting: Emergency Medicine

## 2024-05-09 DIAGNOSIS — M792 Neuralgia and neuritis, unspecified: Secondary | ICD-10-CM

## 2024-05-09 DIAGNOSIS — M5410 Radiculopathy, site unspecified: Secondary | ICD-10-CM | POA: Insufficient documentation

## 2024-05-09 MED ORDER — KETOROLAC TROMETHAMINE 15 MG/ML IJ SOLN
15.0000 mg | Freq: Once | INTRAMUSCULAR | Status: AC
  Administered 2024-05-09: 15 mg via INTRAMUSCULAR
  Filled 2024-05-09: qty 1

## 2024-05-09 NOTE — ED Triage Notes (Signed)
 Pt reports surgery to R elbow x3 years ago. Pt reports sudden pain for x1 month. Pt saw PCP and was prescribed pain meds and steroids. Pt denies any new injury or overexertion.

## 2024-05-09 NOTE — Discharge Instructions (Signed)
 Your pain is likely nerve pain causing sharp shooting pain down your right arm.  Please continue to take prednisone and meloxicam that was prescribed to you by your doctor.  Follow-up closely with your orthopedic specialist for further evaluation.  Return to the ER if you have any concern.

## 2024-05-09 NOTE — ED Provider Notes (Signed)
 [facility] AT [facility] Provider Note   CSN: [id number] Arrival date & time: 05/09/24  2058     Patient presents with: Elbow Pain   Latasha Powell is a 33 y.o. female.   The history is provided by the patient, a relative and medical records. No language interpreter was used.     33 year old female previously had an open fracture of the right elbow requiring surgery 3 years ago presenting complaining of pain to her elbow.  History obtained through patient and through family member who is at bedside.  3 years ago patient injured her right elbow requiring surgery.  For the past year she has had recurrent pain about the elbow that radiates to her shoulder worse with certain position.  For the past week or so it also radiates to the right side of her head and neck.  Pain is uncomfortable but not associate with any weakness or numbness.  She was seen by her primary care provider yesterday for her complaint and did receive prednisone taper course as well as meloxicam for which she is just not taking but today she felt her symptoms was worse prompting this ER visit.  She also had an appointment with her orthopedic surgeon but is not until December of this year.She denies any new injury denies any fever, weakness, numbness, or rash.    Prior to Admission medications   Medication Sig Start Date End Date Taking? Authorizing Provider  famotidine  (PEPCID ) 20 MG tablet Take 1 tablet (20 mg total) by mouth daily. 01/18/24   Mecum, Erin E, PA-C  Ferrous Fumarate  (HEMOCYTE - 106 MG FE) 324 (106 Fe) MG TABS tablet Take 1 tablet (106 mg of iron total) by mouth every other day. Patient not taking: Reported on 01/17/2024 03/11/23   Kumar, Agnijita, MD  gabapentin  (NEURONTIN ) 100 MG capsule Take 2 capsules (200 mg total) by mouth at bedtime. Patient not taking: Reported on 01/17/2024 03/11/23   Kumar, Agnijita, MD  ibuprofen  (ADVIL ) 600 MG tablet Take 1 tablet (600 mg  total) by mouth every 6 (six) hours. Patient not taking: Reported on 01/17/2024 03/18/23   Ervin, Michael L, MD  ondansetron  (ZOFRAN -ODT) 4 MG disintegrating tablet Take 1 tablet (4 mg total) by mouth every 8 (eight) hours as needed for nausea or vomiting. 01/18/24   Mecum, Erin E, PA-C  Prenatal Vit-Fe Fumarate-FA (PREPLUS) 27-1 MG TABS Take 1 tablet by mouth daily. Patient not taking: Reported on 01/17/2024 08/01/22   Dawson, Rolitta, CNM    Allergies: Patient has no known allergies.    Review of Systems  Constitutional:  Negative for fever.  Musculoskeletal:  Positive for arthralgias.    Updated Vital Signs BP (!) 122/103 (BP Location: Left Arm)   Pulse (!) 103   Temp 97.9 F (36.6 C)   Resp 18   Ht 5' 3 (1.6 m)   Wt 72.6 kg   SpO2 100%   BMI 28.34 kg/m   Physical Exam Vitals and nursing note reviewed.  Constitutional:      General: She is not in acute distress.    Appearance: She is well-developed.  HENT:     Head: Atraumatic.  Eyes:     Conjunctiva/sclera: Conjunctivae normal.  Neck:     Vascular: No carotid bruit.  Pulmonary:     Effort: Pulmonary effort is normal.  Musculoskeletal:        General: Tenderness (Tenderness to right side of face, right side of neck, right shoulder and  elbow without any overlying skin changes or deformity noted.  Normal grip strength radial pulse 2+) present.     Cervical back: Normal range of motion and neck supple. No rigidity.  Skin:    Findings: No rash.  Neurological:     Mental Status: She is alert.  Psychiatric:        Mood and Affect: Mood normal.     (all labs ordered are listed, but only abnormal results are displayed) Labs Reviewed - No data to display  EKG: None  Radiology: No results found.   Procedures   Medications Ordered in the ED - No data to display                                  Medical Decision Making  BP (!) 122/103 (BP Location: Left Arm)   Pulse (!) 103   Temp 97.9 F (36.6 C)   Resp 18    Ht 5' 3 (1.6 m)   Wt 72.6 kg   SpO2 100%   BMI 28.34 kg/m   27:71 PM 33 year old female previously had an open fracture of the right elbow requiring surgery 3 years ago presenting complaining of pain to her elbow.  History obtained through patient and through family member who is at bedside.  3 years ago patient injured her right elbow requiring surgery.  For the past year she has had recurrent pain about the elbow that radiates to her shoulder worse with certain position.  For the past week or so it also radiates to the right side of her head and neck.  Pain is uncomfortable but not associate with any weakness or numbness.  She was seen by her primary care provider yesterday for her complaint and did receive prednisone taper course as well as meloxicam for which she is just not taking but today she felt her symptoms was worse prompting this ER visit.  She also had an appointment with her orthopedic surgeon but is not until December of this year.She denies any new injury denies any fever, weakness, numbness, or rash.    Exam notable for tenderness to palpation of right side of face, right neck, right shoulder and right elbow however patient is neurovasc intact in no concerning skin changes no signs of infection noted.  She has normal grip strength.  X-ray of right elbow obtained independent viewed interpreted by me and is normal.  Patient received Toradol  for pain with improvement of symptoms.  Will discharge home with recommendation to continue with steroid and meloxicam that was previously prescribed and to have patient follow-up with orthopedic specialist for outpatient care.  I gave return precaution.  Suspect pain is likely radicular pain from nerve impingement.  I have low suspicion for septic joint, tendinitis, tenosynovitis, other acute emergent medical condition.     Final diagnoses:  Radicular pain in right arm    ED Discharge Orders     None          Nivia Colon,  PA-C 05/09/24 2254    Emil Share, DO 05/09/24 2315

## 2024-06-29 ENCOUNTER — Telehealth

## 2024-06-29 DIAGNOSIS — H109 Unspecified conjunctivitis: Secondary | ICD-10-CM | POA: Diagnosis not present

## 2024-06-30 MED ORDER — POLYMYXIN B-TRIMETHOPRIM 10000-0.1 UNIT/ML-% OP SOLN: 1.0000 [drp] | OPHTHALMIC | 0 refills | Status: AC

## 2024-06-30 NOTE — Progress Notes (Signed)
 Hello,  Referrals are completed by PCPs. You will need to establish with a PCP for on ophthalmology referral    E-Visit for [facility]   We are sorry that you are not feeling well.  Here is how we plan to help!  Based on what you have shared with me it looks like you have conjunctivitis.  Conjunctivitis is a common inflammatory or infectious condition of the eye that is often referred to as pink eye.  In most cases it is contagious (viral or bacterial). However, not all conjunctivitis requires antibiotics (ex. Allergic).  We have made appropriate suggestions for you based upon your presentation.  I have prescribed Polytrim  Ophthalmic drops 1-2 drops 4 times a day times 5 days  Pink eye can be highly contagious.  It is typically spread through direct contact with secretions, or contaminated objects or surfaces that one may have touched.  Strict handwashing is suggested with soap and water  is urged.  If not available, use alcohol based had sanitizer.  Avoid unnecessary touching of the eye.  If you wear contact lenses, you will need to refrain from wearing them until you see no white discharge from the eye for at least 24 hours after being on medication.  You should see symptom improvement in 1-2 days after starting the medication regimen.  Call us  if symptoms are not improved in 1-2 days.  Home Care: Wash your hands often! Do not wear your contacts until you complete your treatment plan. Avoid sharing towels, bed linen, personal items with a person who has pink eye. See attention for anyone in your home with similar symptoms.  Get Help Right Away If: Your symptoms do not improve. You develop blurred or loss of vision. Your symptoms worsen (increased discharge, pain or redness)   Thank you for choosing an e-visit.  Your e-visit answers were reviewed by a board certified advanced clinical practitioner to complete your personal care plan. Depending upon the condition, your plan could have  included both over the counter or prescription medications.  Please review your pharmacy choice. Make sure the pharmacy is open so you can pick up prescription now. If there is a problem, you may contact your provider through [employer] and have the prescription routed to another pharmacy.  Your safety is important to us . If you have drug allergies check your prescription carefully.   For the next 24 hours you can use MyChart to ask questions about today's visit, request a non-urgent call back, or ask for a work or school excuse. You will get an email in the next two days asking about your experience. I hope that your e-visit has been valuable and will speed your recovery.  I have spent 5 minutes in review of e-visit questionnaire, review and updating patient chart, medical decision making and response to patient.   Alayzha An W Elton Catalano, NP
